# Patient Record
Sex: Female | Born: 1990 | Race: Black or African American | Hispanic: No | Marital: Single | Smoking: Former smoker
Health system: Southern US, Community
[De-identification: ages and names within clinical notes are randomized; demographics above are authoritative.]

## PROBLEM LIST (undated history)

## (undated) DIAGNOSIS — E282 Polycystic ovarian syndrome: Secondary | ICD-10-CM

---

## 2016-12-26 ENCOUNTER — Emergency Department (HOSPITAL_COMMUNITY)
Admission: EM | Admit: 2016-12-26 | Discharge: 2016-12-26 | Disposition: A | Discharge status: Home / Self Care | Payer: Medicaid Other | Attending: Emergency Medicine | Admitting: Emergency Medicine

## 2016-12-26 ENCOUNTER — Encounter (HOSPITAL_COMMUNITY): Payer: Self-pay

## 2016-12-26 DIAGNOSIS — Z5321 Procedure and treatment not carried out due to patient leaving prior to being seen by health care provider: Secondary | ICD-10-CM | POA: Insufficient documentation

## 2016-12-26 DIAGNOSIS — R197 Diarrhea, unspecified: Secondary | ICD-10-CM | POA: Insufficient documentation

## 2016-12-26 DIAGNOSIS — R1084 Generalized abdominal pain: Secondary | ICD-10-CM | POA: Diagnosis present

## 2016-12-26 LAB — URINALYSIS, ROUTINE W REFLEX MICROSCOPIC
Bilirubin Urine: NEGATIVE
Glucose, UA: NEGATIVE mg/dL
Hgb urine dipstick: NEGATIVE
Ketones, ur: NEGATIVE mg/dL
Nitrite: NEGATIVE
Protein, ur: NEGATIVE mg/dL
Specific Gravity, Urine: 1.029 (ref 1.005–1.030)
pH: 5 (ref 5.0–8.0)

## 2016-12-26 LAB — CBC
HCT: 42.9 % (ref 36.0–46.0)
Hemoglobin: 13.5 g/dL (ref 12.0–15.0)
MCH: 28.5 pg (ref 26.0–34.0)
MCHC: 31.5 g/dL (ref 30.0–36.0)
MCV: 90.5 fL (ref 78.0–100.0)
Platelets: 160 10*3/uL (ref 150–400)
RBC: 4.74 MIL/uL (ref 3.87–5.11)
RDW: 13.5 % (ref 11.5–15.5)
WBC: 7.5 10*3/uL (ref 4.0–10.5)

## 2016-12-26 LAB — COMPREHENSIVE METABOLIC PANEL
ALT: 19 U/L (ref 14–54)
AST: 16 U/L (ref 15–41)
Albumin: 3.6 g/dL (ref 3.5–5.0)
Alkaline Phosphatase: 62 U/L (ref 38–126)
Anion gap: 6 (ref 5–15)
BUN: 6 mg/dL (ref 6–20)
CO2: 25 mmol/L (ref 22–32)
Calcium: 9 mg/dL (ref 8.9–10.3)
Chloride: 109 mmol/L (ref 101–111)
Creatinine, Ser: 0.77 mg/dL (ref 0.44–1.00)
GFR calc Af Amer: >60 mL/min (ref >60)
GFR calc non Af Amer: >60 mL/min (ref >60)
Glucose, Bld: 102 mg/dL — ABNORMAL HIGH (ref 65–99)
Potassium: 3.9 mmol/L (ref 3.5–5.1)
Sodium: 140 mmol/L (ref 135–145)
Total Bilirubin: 0.6 mg/dL (ref 0.3–1.2)
Total Protein: 6.6 g/dL (ref 6.5–8.1)

## 2016-12-26 LAB — LIPASE, BLOOD: Lipase: 22 U/L (ref 11–51)

## 2016-12-26 LAB — I-STAT BETA HCG BLOOD, ED (MC, WL, AP ONLY): I-stat hCG, quantitative: <5 m[IU]/mL (ref <5)

## 2016-12-26 NOTE — ED Triage Notes (Signed)
Patient complains of generalized abdominal pain with diarrhea for the past several days. Nausea without vomiting. Alert and oriented, NAD

## 2016-12-26 NOTE — ED Notes (Signed)
Pt sts she was leaving and not returning; encouraged to stay; pt ambulated out of department

## 2017-06-29 ENCOUNTER — Other Ambulatory Visit: Payer: Self-pay

## 2017-06-29 ENCOUNTER — Emergency Department (HOSPITAL_COMMUNITY)
Admission: EM | Admit: 2017-06-29 | Discharge: 2017-06-29 | Disposition: A | Discharge status: Home / Self Care | Payer: Medicaid Other | Attending: Emergency Medicine | Admitting: Emergency Medicine

## 2017-06-29 ENCOUNTER — Encounter (HOSPITAL_COMMUNITY): Payer: Self-pay

## 2017-06-29 DIAGNOSIS — G5601 Carpal tunnel syndrome, right upper limb: Secondary | ICD-10-CM | POA: Diagnosis not present

## 2017-06-29 DIAGNOSIS — F17210 Nicotine dependence, cigarettes, uncomplicated: Secondary | ICD-10-CM | POA: Diagnosis not present

## 2017-06-29 DIAGNOSIS — M25531 Pain in right wrist: Secondary | ICD-10-CM | POA: Diagnosis present

## 2017-06-29 MED ORDER — NAPROXEN 500 MG PO TABS: 500.0000 mg | ORAL_TABLET | Freq: Two times a day (BID) | ORAL | 0 refills | Status: DC

## 2017-06-29 MED ORDER — KETOROLAC TROMETHAMINE 30 MG/ML IJ SOLN
30.0000 mg | Freq: Once | INTRAMUSCULAR | Status: AC
  Administered 2017-06-29: 30 mg via INTRAMUSCULAR
  Filled 2017-06-29: qty 1

## 2017-06-29 MED ORDER — OXYCODONE-ACETAMINOPHEN 5-325 MG PO TABS: 1.0000 | ORAL_TABLET | Freq: Three times a day (TID) | ORAL | 0 refills | Status: DC | PRN

## 2017-06-29 NOTE — Discharge Instructions (Signed)
Please read attached information regarding your condition. Take Percocet as needed for severe pain. Otherwise take naproxen scheduled for the next 5-7 days.  Wear your wrist splint as directed, especially at night. Return to ED for worsening symptoms, trouble breathing or trouble swallowing, numbness of face or legs, injuries or falls.

## 2017-06-29 NOTE — ED Notes (Signed)
shartp pain and swelling in rt hand x 3 days

## 2017-06-29 NOTE — ED Triage Notes (Signed)
Patient complains of 3-4 days of right hand pain with tingling from elbow down, denies trauma. Positive distal pulses

## 2017-06-29 NOTE — ED Provider Notes (Signed)
[facility] Provider Note   CSN: [id number] Arrival date & time: 06/29/17  0801     History   Chief Complaint No chief complaint on file.   HPI Rachel Hull is a 27 y.o. female no significant past medical history, who presents to ED for evaluation of 3-day history of sharp, shooting pain from right wrist to right elbow.  Pain is worse with movement.  She works Education officer, communitymaking sandwiches.  She has tried taking ibuprofen with no improvement in her symptoms.  States that the pain initially felt like "I slept on it wrong."  She then states that it progressed.  No previous history of similar symptoms in the past.  Cannot recall any inciting incident that may have triggered the pain.  Denies any previous fracture, dislocations or procedures in the area.  Denies any facial numbness, numbness in legs, injuries or falls, history of gout, fevers.  HPI  History reviewed. No pertinent past medical history.  There are no active problems to display for this patient.   History reviewed. No pertinent surgical history.  OB History    No data available       Home Medications    Prior to Admission medications   Medication Sig Start Date End Date Taking? Authorizing Provider  naproxen (NAPROSYN) 500 MG tablet Take 1 tablet (500 mg total) by mouth 2 (two) times daily. 06/29/17   Tanza Pellot, PA-C  oxyCODONE-acetaminophen (PERCOCET/ROXICET) 5-325 MG tablet Take 1 tablet by mouth every 8 (eight) hours as needed for severe pain. 06/29/17   Dietrich PatesKhatri, Starling Christofferson, PA-C    Family History No family history on file.  Social History Social History   Tobacco Use  . Smoking status: Current Every Day Smoker    Types: Cigarettes  . Smokeless tobacco: Never Used  Substance Use Topics  . Alcohol use: No  . Drug use: Not on file     Allergies   Flagyl [metronidazole]   Review of Systems Review of Systems  Constitutional: Negative for chills and fever.    Gastrointestinal: Negative for nausea and vomiting.  Musculoskeletal: Positive for arthralgias and myalgias. Negative for back pain, gait problem, joint swelling, neck pain and neck stiffness.  Skin: Negative for wound.  Neurological: Negative for weakness and numbness.     Physical Exam Updated Vital Signs BP 107/74   Pulse 95   Temp 99 F (37.2 C) (Oral)   Resp 18   SpO2 100%   Physical Exam  Constitutional: She appears well-developed and well-nourished. No distress.  Nontoxic appearing in no acute distress.  HENT:  Head: Normocephalic and atraumatic.  Eyes: Conjunctivae and EOM are normal. No scleral icterus.  Neck: Normal range of motion.  Pulmonary/Chest: Effort normal. No respiratory distress.  Musculoskeletal: She exhibits tenderness. She exhibits no edema or deformity.  Positive Phalen's test.  Negative Tinel's test.  Patient reports pain with performing grip strengths of right hand.  Full active and passive range of motion of wrist and digits of the right hand.  2+ radial pulse noted.  Sensation intact to light touch of bilateral upper extremities.  No facial asymmetry noted.  Neurological: She is alert.  Skin: No rash noted. She is not diaphoretic.  Psychiatric: She has a normal mood and affect.  Nursing note and vitals reviewed.    ED Treatments / Results  Labs (all labs ordered are listed, but only abnormal results are displayed) Labs Reviewed - No data to display  EKG  EKG Interpretation None  Radiology No results found.  Procedures Procedures (including critical care time)  Medications Ordered in ED Medications  ketorolac (TORADOL) 30 MG/ML injection 30 mg (not administered)     Initial Impression / Assessment and Plan / ED Course  I have reviewed the triage vital signs and the nursing notes.  Pertinent labs & imaging results that were available during my care of the patient were reviewed by me and considered in my medical decision  making (see chart for details).     Patient presents to ED for evaluation of 3-day history of right wrist tingling sensation, sharp shooting pain radiating up to the elbow.  Denies any injury or inciting event.  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  No previous history of similar symptoms.  On physical exam there is positive Phalen's test but negative Tinel's test.  There is no visible deformity, edema, warmth or color change of joint that would concern me for infectious or vascular cause.  She does appear uncomfortable with performing range of motion but is able to perform.  I suspect that her symptoms could be due to carpal tunnel syndrome based on the nature of her job and the symptoms that she is describing.  Will give a wrist splint, anti-inflammatories and short course of pain medication to be taken as needed.  [place] narcotic database reviewed with no discrepancies.  Advised to follow-up with orthopedics for further evaluation if symptoms persist.  Patient appears stable for discharge at this time.  Strict return precautions given.  Portions of this note were generated with Scientist, clinical (histocompatibility and immunogenetics). Dictation errors may occur despite best attempts at proofreading.  Final Clinical Impressions(s) / ED Diagnoses   Final diagnoses:  Carpal tunnel syndrome of right wrist    ED Discharge Orders        Ordered    oxyCODONE-acetaminophen (PERCOCET/ROXICET) 5-325 MG tablet  Every 8 hours PRN     06/29/17 0942    naproxen (NAPROSYN) 500 MG tablet  2 times daily     06/29/17 0942       Dietrich Pates, PA-C 06/29/17 1308    Bethann Berkshire, MD 06/29/17 1546

## 2017-10-08 ENCOUNTER — Ambulatory Visit (HOSPITAL_COMMUNITY)
Admission: EM | Admit: 2017-10-08 | Discharge: 2017-10-08 | Disposition: A | Discharge status: Home / Self Care | Payer: Medicaid Other | Attending: Family Medicine | Admitting: Family Medicine

## 2017-10-08 ENCOUNTER — Other Ambulatory Visit: Payer: Self-pay

## 2017-10-08 ENCOUNTER — Encounter (HOSPITAL_COMMUNITY): Payer: Self-pay | Admitting: Emergency Medicine

## 2017-10-08 DIAGNOSIS — Z881 Allergy status to other antibiotic agents status: Secondary | ICD-10-CM | POA: Insufficient documentation

## 2017-10-08 DIAGNOSIS — N926 Irregular menstruation, unspecified: Secondary | ICD-10-CM | POA: Insufficient documentation

## 2017-10-08 DIAGNOSIS — Z8249 Family history of ischemic heart disease and other diseases of the circulatory system: Secondary | ICD-10-CM | POA: Insufficient documentation

## 2017-10-08 DIAGNOSIS — F17210 Nicotine dependence, cigarettes, uncomplicated: Secondary | ICD-10-CM | POA: Diagnosis not present

## 2017-10-08 DIAGNOSIS — K5900 Constipation, unspecified: Secondary | ICD-10-CM | POA: Diagnosis not present

## 2017-10-08 DIAGNOSIS — N939 Abnormal uterine and vaginal bleeding, unspecified: Secondary | ICD-10-CM | POA: Insufficient documentation

## 2017-10-08 LAB — POCT I-STAT, CHEM 8
BUN: 5 mg/dL — ABNORMAL LOW (ref 6–20)
Calcium, Ion: 1.2 mmol/L (ref 1.15–1.40)
Chloride: 102 mmol/L (ref 101–111)
Creatinine, Ser: 0.7 mg/dL (ref 0.44–1.00)
Glucose, Bld: 83 mg/dL (ref 65–99)
HCT: 45 % (ref 36.0–46.0)
Hemoglobin: 15.3 g/dL — ABNORMAL HIGH (ref 12.0–15.0)
Potassium: 3.8 mmol/L (ref 3.5–5.1)
Sodium: 139 mmol/L (ref 135–145)
TCO2: 25 mmol/L (ref 22–32)

## 2017-10-08 LAB — POCT PREGNANCY, URINE: Preg Test, Ur: NEGATIVE

## 2017-10-08 MED ORDER — NORETHIN ACE-ETH ESTRAD-FE 1-20 MG-MCG PO TABS: 1.0000 | ORAL_TABLET | Freq: Every day | ORAL | 11 refills | Status: DC

## 2017-10-08 NOTE — Discharge Instructions (Signed)
We are testing you for bacterial infections as possible causes of your vaginal bleeding.  Will notify you of any positive findings and if any changes to treatment are needed.   In the mean time we will start you on oral birth control to try to stop the bleeding.  Please establish with a gynecologist for recheck within the next month.  If you develop worsening of pain, bleeding, dizziness, or otherwise worsening please return to be seen or go to Er.

## 2017-10-08 NOTE — ED Provider Notes (Signed)
[facility]    CSN: [id number] Arrival date & time: 10/08/17  1124     History   Chief Complaint Chief Complaint  Patient presents with  . Vaginal Bleeding    HPI Jerilynn Blain PaisBetrand is a 27 y.o. female.   Sherry Ruffingntoinette presents with complaints of vaginal bleeding which has been ongoing for the past month. States she has had irregular periods, but typically have never lasted an entire month. Has waxed in waned, sometimes spotting and then will become heavy again. Today has been more heavy. Mild suprapubic discomfort. No fevers. No urinary symptoms. Has been constipated. Does not have a local PCP or gynecologist [place]. Sexually active with 1 partner, does not use condoms. No specific known STD concern. Not on birth control . Denies any other previous medical history.    ROS per HPI.      History reviewed. No pertinent past medical history.  There are no active problems to display for this patient.   History reviewed. No pertinent surgical history.  OB History   None      Home Medications    Prior to Admission medications   Medication Sig Start Date End Date Taking? Authorizing Provider  norethindrone-ethinyl estradiol (JUNEL FE,GILDESS FE,LOESTRIN FE) 1-20 MG-MCG tablet Take 1 tablet by mouth daily. 10/08/17   [name], Rahil Passey B, NP    Family History Family History  Problem Relation Age of Onset  . Hypertension Mother     Social History Social History   Tobacco Use  . Smoking status: Current Every Day Smoker    Types: Cigarettes  . Smokeless tobacco: Never Used  Substance Use Topics  . Alcohol use: No  . Drug use: Not on file     Allergies   Flagyl [metronidazole]   Review of Systems Review of Systems   Physical Exam Triage Vital Signs ED Triage Vitals  Enc Vitals Group     BP 10/08/17 1237 109/68     Pulse Rate 10/08/17 1237 85     Resp 10/08/17 1237 20     Temp 10/08/17 1237 98.9 F (37.2 C)     Temp  Source 10/08/17 1237 Oral     SpO2 10/08/17 1237 100 %     Weight --      Height --      Head Circumference --      Peak Flow --      Pain Score 10/08/17 1234 7     Pain Loc --      Pain Edu? --      Excl. in GC? --    No data found.  Updated Vital Signs BP 109/68 (BP Location: Right Arm) Comment (BP Location): large cuff  Pulse 85   Temp 98.9 F (37.2 C) (Oral)   Resp 20   SpO2 100%   Physical Exam  Constitutional: She is oriented to person, place, and time. She appears well-developed and well-nourished. No distress.  Cardiovascular: Normal rate, regular rhythm and normal heart sounds.  Pulmonary/Chest: Effort normal and breath sounds normal.  Abdominal: Soft. There is no tenderness.  Genitourinary: Cervix exhibits no motion tenderness. There is bleeding in the vagina.  Genitourinary Comments: Very slight generalized suprapubic tenderness with bimanual exam; moderate to large vaginal bleeding; vaginal cytology collected   Neurological: She is alert and oriented to person, place, and time.  Skin: Skin is warm and dry.     UC Treatments / Results  Labs (all labs ordered are listed, but  only abnormal results are displayed) Labs Reviewed  POCT I-STAT, CHEM 8 - Abnormal; Notable for the following components:      Result Value   BUN 5 (*)    Hemoglobin 15.3 (*)    All other components within normal limits  URINE CULTURE  POCT PREGNANCY, URINE  CERVICOVAGINAL ANCILLARY ONLY    EKG None  Radiology No results found.  Procedures Procedures (including critical care time)  Medications Ordered in UC Medications - No data to display  Initial Impression / Assessment and Plan / UC Course  I have reviewed the triage vital signs and the nursing notes.  Pertinent labs & imaging results that were available during my care of the patient were reviewed by me and considered in my medical decision making (see chart for details).     Chem 8 reassuring. Urine culture and  vaginal cytology pending at this time. Oral birth control initiated at this time due to 1 month of bleeding. Encouraged establish and follow up with gynecology for recheck. Return precautions provided. Patient verbalized understanding and agreeable to plan.  Ambulatory out of clinic without difficulty.    Final Clinical Impressions(s) / UC Diagnoses   Final diagnoses:  Abnormal uterine bleeding     Discharge Instructions     We are testing you for bacterial infections as possible causes of your vaginal bleeding.  Will notify you of any positive findings and if any changes to treatment are needed.   In the mean time we will start you on oral birth control to try to stop the bleeding.  Please establish with a gynecologist for recheck within the next month.  If you develop worsening of pain, bleeding, dizziness, or otherwise worsening please return to be seen or go to Er.     ED Prescriptions    Medication Sig Dispense Auth. Provider   norethindrone-ethinyl estradiol (JUNEL FE,GILDESS FE,LOESTRIN FE) 1-20 MG-MCG tablet Take 1 tablet by mouth daily. 1 Package Georgetta Haber, NP     Controlled Substance Prescriptions [place] Controlled Substance Registry consulted? Not Applicable   Georgetta Haber, NP 10/08/17 1326

## 2017-10-08 NOTE — ED Triage Notes (Signed)
Vaginal bleeding intermittently for a month.  Low abdominal tenderness.  Denies pain with urination

## 2017-10-08 NOTE — ED Notes (Signed)
PATIENT SENT FOR A DIRTY AND CLEAN SPECIMEN

## 2017-10-09 LAB — CERVICOVAGINAL ANCILLARY ONLY
Bacterial vaginitis: POSITIVE — AB
Candida vaginitis: NEGATIVE
Chlamydia: NEGATIVE
Neisseria Gonorrhea: NEGATIVE
Trichomonas: NEGATIVE

## 2017-10-09 LAB — URINE CULTURE: Culture: <10000 — AB

## 2017-10-12 ENCOUNTER — Telehealth (HOSPITAL_COMMUNITY): Payer: Self-pay

## 2017-10-12 MED ORDER — CLINDAMYCIN HCL 300 MG PO CAPS: 300.0000 mg | ORAL_CAPSULE | Freq: Three times a day (TID) | ORAL | 0 refills | Status: AC

## 2017-10-12 NOTE — Telephone Encounter (Signed)
Bacterial vaginosis is positive. This was not treated at the urgent care visit.  Attempted to reach patient. No answer at this time.

## 2017-10-12 NOTE — Telephone Encounter (Signed)
Pt called regarding results. bv positive and not treated at urgent care. Pt does have symptoms but is allergic to Flagyl.  Per Dr. Delton SeeNelson patient given Clindamycin 300 mg TID x 7 days and sent to the pt pharmacy of choice.

## 2017-10-29 ENCOUNTER — Other Ambulatory Visit: Payer: Self-pay

## 2017-10-29 ENCOUNTER — Encounter (HOSPITAL_COMMUNITY): Payer: Self-pay | Admitting: Emergency Medicine

## 2017-10-29 ENCOUNTER — Ambulatory Visit (HOSPITAL_COMMUNITY)
Admission: EM | Admit: 2017-10-29 | Discharge: 2017-10-29 | Disposition: A | Discharge status: Home / Self Care | Payer: Medicaid Other | Attending: Family Medicine | Admitting: Family Medicine

## 2017-10-29 DIAGNOSIS — Z09 Encounter for follow-up examination after completed treatment for conditions other than malignant neoplasm: Secondary | ICD-10-CM

## 2017-10-29 DIAGNOSIS — N938 Other specified abnormal uterine and vaginal bleeding: Secondary | ICD-10-CM

## 2017-10-29 NOTE — ED Notes (Signed)
Bed: UC01 Expected date:  Expected time:  Means of arrival:  Comments: Appt Only 

## 2017-10-29 NOTE — ED Triage Notes (Signed)
Seen 6/10.  Told to return after finishing medicines.  Vaginal bleeding has stopped.  Patient wants to make sure medicines helped infection

## 2017-10-29 NOTE — Discharge Instructions (Signed)
Please establish with a primary care provider for continued management of menstruation.  If any return or worsening of symptoms return for reevaluation.

## 2017-10-29 NOTE — ED Provider Notes (Signed)
[facility]    CSN: [id number] Arrival date & time: 10/29/17  1549     History   Chief Complaint Chief Complaint  Patient presents with  . Follow-up    appt 4:00 pm    HPI Rachel Hull is a 27 y.o. female.   Rachel Hull presents for follow up after visit 6/10 for abnormal uterine bleeding. Urine cytology positive for BV at that time, states she completed course of treatment. Has since been on oral birth control. No longer having any vaginal bleeding. Does not have a PCP as recently moved to the area. States she was unsure if she needed repeat testing to ensure cure of BV. Denies any vaginal symptoms- discharge, itching, burning, bleeding. No urinary symptoms or fevers. She states she overall feels well.    ROS per HPI.      History reviewed. No pertinent past medical history.  There are no active problems to display for this patient.   History reviewed. No pertinent surgical history.  OB History   None      Home Medications    Prior to Admission medications   Medication Sig Start Date End Date Taking? Authorizing Provider  norethindrone-ethinyl estradiol (JUNEL FE,GILDESS FE,LOESTRIN FE) 1-20 MG-MCG tablet Take 1 tablet by mouth daily. 10/08/17   Rachel Hull, Selby Foisy B, NP    Family History Family History  Problem Relation Age of Onset  . Hypertension Mother     Social History Social History   Tobacco Use  . Smoking status: Current Every Day Smoker    Types: Cigarettes  . Smokeless tobacco: Never Used  Substance Use Topics  . Alcohol use: No  . Drug use: Not on file     Allergies   Flagyl [metronidazole]   Review of Systems Review of Systems   Physical Exam Triage Vital Signs ED Triage Vitals  Enc Vitals Group     BP 10/29/17 1613 109/73     Pulse Rate 10/29/17 1613 89     Resp 10/29/17 1613 18     Temp 10/29/17 1613 98.3 F (36.8 C)     Temp Source 10/29/17 1613 Oral     SpO2 10/29/17 1613 99 %     Weight --      Height  --      Head Circumference --      Peak Flow --      Pain Score 10/29/17 1605 0     Pain Loc --      Pain Edu? --      Excl. in GC? --    No data found.  Updated Vital Signs BP 109/73 (BP Location: Right Arm) Comment: large cuff  Pulse 89   Temp 98.3 F (36.8 C) (Oral)   Resp 18   SpO2 99%    Physical Exam  Constitutional: She is oriented to person, place, and time. She appears well-developed and well-nourished. No distress.  Cardiovascular: Normal rate, regular rhythm and normal heart sounds.  Pulmonary/Chest: Effort normal and breath sounds normal.  Neurological: She is alert and oriented to person, place, and time.  Skin: Skin is warm and dry.     UC Treatments / Results  Labs (all labs ordered are listed, but only abnormal results are displayed) Labs Reviewed - No data to display  EKG None  Radiology No results found.  Procedures Procedures (including critical care time)  Medications Ordered in UC Medications - No data to display  Initial Impression / Assessment and Plan / UC Course  I have reviewed the triage vital signs and the nursing notes.  Pertinent labs & imaging results that were available during my care of the patient were reviewed by me and considered in my medical decision making (see chart for details).     Currently without any vaginal symptoms after BV treatment. No longer with any vaginal bleeding. Still taking birth control. No further testing indicated at this time. Encouraged establish with PCP/gyn for continued management. Patient verbalized understanding and agreeable to plan.    Final Clinical Impressions(s) / UC Diagnoses   Final diagnoses:  Follow-up exam after treatment     Discharge Instructions     Please establish with a primary care provider for continued management of menstruation.  If any return or worsening of symptoms return for reevaluation.    ED Prescriptions    None     Controlled Substance  Prescriptions [place] Controlled Substance Registry consulted? Not Applicable   Rachel Haber, NP 10/29/17 1645

## 2018-03-16 ENCOUNTER — Ambulatory Visit (HOSPITAL_COMMUNITY)
Admission: EM | Admit: 2018-03-16 | Discharge: 2018-03-16 | Disposition: A | Discharge status: Home / Self Care | Payer: Medicaid Other | Attending: Radiology | Admitting: Radiology

## 2018-03-16 ENCOUNTER — Encounter (HOSPITAL_COMMUNITY): Payer: Self-pay | Admitting: Emergency Medicine

## 2018-03-16 ENCOUNTER — Other Ambulatory Visit: Payer: Self-pay

## 2018-03-16 DIAGNOSIS — N644 Mastodynia: Secondary | ICD-10-CM

## 2018-03-16 MED ORDER — CEPHALEXIN 500 MG PO CAPS: 500.0000 mg | ORAL_CAPSULE | Freq: Four times a day (QID) | ORAL | 0 refills | Status: DC

## 2018-03-16 NOTE — ED Provider Notes (Signed)
[facility]    CSN: [id number] Arrival date & time: 03/16/18  1654     History   Chief Complaint Chief Complaint  Patient presents with  . Breast Pain    HPI Rachel Hull is a 27 y.o. female.   27 year old female presents with right breast pain and discharge 3 days patient states that she previously had a nipple ring however increased tendency with menses caused her to remove the nipple ring approximately 1 month ago patient states that she has increased tenderness with discharge initially from the former nipple ring site and then through the nipple itself patient describes initially as clear becoming a milky and blood tinged no discharge noted upon assessment.  Patient denies any fevers.  Patient has dense area at 4:00 with increased pain upon palpation and erythema around her areola.  Patient denies breast-feeding at this time.     History reviewed. No pertinent past medical history.  There are no active problems to display for this patient.   History reviewed. No pertinent surgical history.  OB History   None      Home Medications    Prior to Admission medications   Not on File    Family History Family History  Problem Relation Age of Onset  . Hypertension Mother     Social History Social History   Tobacco Use  . Smoking status: Current Every Day Smoker    Types: Cigarettes  . Smokeless tobacco: Never Used  Substance Use Topics  . Alcohol use: No  . Drug use: Not on file     Allergies   Flagyl [metronidazole]   Review of Systems Review of Systems  Constitutional: Negative for chills and fever.  HENT: Negative for ear pain and sore throat.   Eyes: Negative for pain and visual disturbance.  Respiratory: Negative for cough and shortness of breath.   Cardiovascular: Negative for chest pain and palpitations.  Gastrointestinal: Negative for abdominal pain and vomiting.  Genitourinary: Negative for dysuria and hematuria.    Musculoskeletal: Negative for arthralgias and back pain.  Skin: Negative for color change and rash.       Tenderness redness and discharge to right nipple  Neurological: Negative for seizures and syncope.  All other systems reviewed and are negative.    Physical Exam Triage Vital Signs ED Triage Vitals  Enc Vitals Group     BP 03/16/18 1717 108/68     Pulse Rate 03/16/18 1717 (!) 105     Resp 03/16/18 1717 18     Temp 03/16/18 1717 98.6 F (37 C)     Temp Source 03/16/18 1717 Oral     SpO2 03/16/18 1717 99 %     Weight --      Height --      Head Circumference --      Peak Flow --      Pain Score 03/16/18 1715 7     Pain Loc --      Pain Edu? --      Excl. in GC? --    No data found.  Updated Vital Signs BP 108/68 (BP Location: Right Arm)   Pulse (!) 105   Temp 98.6 F (37 C) (Oral)   Resp 18   SpO2 99%   Visual Acuity Right Eye Distance:   Left Eye Distance:   Bilateral Distance:    Right Eye Near:   Left Eye Near:    Bilateral Near:     Physical Exam  Constitutional:  She is oriented to person, place, and time. She appears well-developed and well-nourished.  HENT:  Head: Normocephalic and atraumatic.  Eyes: Conjunctivae are normal.  Neck: Normal range of motion.  Pulmonary/Chest: Effort normal.  Neurological: She is alert and oriented to person, place, and time.  Skin: Rash noted.  Erythema noted around the areole out to the right breast with dense area at 4:00.  No discharge noted upon evaluation.  Psychiatric: She has a normal mood and affect.  Nursing note and vitals reviewed.    UC Treatments / Results  Labs (all labs ordered are listed, but only abnormal results are displayed) Labs Reviewed - No data to display  EKG None  Radiology No results found.  Procedures Procedures (including critical care time)  Medications Ordered in UC Medications - No data to display  Initial Impression / Assessment and Plan / UC Course  I have  reviewed the triage vital signs and the nursing notes.  Pertinent labs & imaging results that were available during my care of the patient were reviewed by me and considered in my medical decision making (see chart for details).      Final Clinical Impressions(s) / UC Diagnoses   Final diagnoses:  None   Discharge Instructions   None    ED Prescriptions    None     Controlled Substance Prescriptions [place] Controlled Substance Registry consulted? Not Applicable   Alene Mires, NP 03/16/18 1736

## 2018-03-16 NOTE — Discharge Instructions (Addendum)
You will be contacted to schedule US  for further evaluation.

## 2018-03-16 NOTE — ED Triage Notes (Addendum)
[facility] a complaint of right breast soreness and tenderness x 3 days. The patient reported that she had nipple rings that she removed last month due to increased pain during menses.

## 2018-03-19 ENCOUNTER — Other Ambulatory Visit: Payer: Self-pay | Admitting: Radiology

## 2018-03-19 ENCOUNTER — Other Ambulatory Visit: Payer: Self-pay

## 2018-03-19 DIAGNOSIS — R5381 Other malaise: Secondary | ICD-10-CM

## 2018-04-02 ENCOUNTER — Telehealth (HOSPITAL_COMMUNITY): Payer: Self-pay | Admitting: Emergency Medicine

## 2018-04-02 ENCOUNTER — Other Ambulatory Visit: Payer: Self-pay | Admitting: Radiology

## 2018-04-02 DIAGNOSIS — N6452 Nipple discharge: Secondary | ICD-10-CM

## 2018-04-02 NOTE — Telephone Encounter (Signed)
Pt called; resent fax to breast center and told pt to call them provided number

## 2018-04-03 ENCOUNTER — Other Ambulatory Visit: Payer: Self-pay | Admitting: Family Medicine

## 2018-04-03 ENCOUNTER — Other Ambulatory Visit: Payer: Self-pay

## 2018-04-03 ENCOUNTER — Telehealth (HOSPITAL_COMMUNITY): Payer: Self-pay | Admitting: Emergency Medicine

## 2018-04-03 DIAGNOSIS — N6452 Nipple discharge: Secondary | ICD-10-CM

## 2018-04-03 NOTE — Telephone Encounter (Signed)
Center needed new order signed by Physician.  A new request was sent with mammogram and US checked with Dr. Tommi RumpsLauensteins signature along with a copy of the order also signed.

## 2018-04-04 ENCOUNTER — Other Ambulatory Visit: Payer: Self-pay | Admitting: Radiology

## 2018-04-04 ENCOUNTER — Ambulatory Visit
Admission: RE | Admit: 2018-04-04 | Discharge: 2018-04-04 | Disposition: A | Discharge status: Home / Self Care | Payer: Medicaid Other | Source: Ambulatory Visit | Attending: Radiology | Admitting: Radiology

## 2018-04-04 DIAGNOSIS — N6452 Nipple discharge: Secondary | ICD-10-CM

## 2018-04-19 ENCOUNTER — Other Ambulatory Visit: Payer: Self-pay | Admitting: Radiology

## 2018-04-22 ENCOUNTER — Ambulatory Visit
Admission: RE | Admit: 2018-04-22 | Discharge: 2018-04-22 | Disposition: A | Discharge status: Home / Self Care | Payer: Medicaid Other | Source: Ambulatory Visit | Attending: Radiology | Admitting: Radiology

## 2018-04-22 ENCOUNTER — Other Ambulatory Visit: Payer: Self-pay | Admitting: Radiology

## 2018-04-22 DIAGNOSIS — N6452 Nipple discharge: Secondary | ICD-10-CM

## 2019-02-24 ENCOUNTER — Ambulatory Visit (HOSPITAL_COMMUNITY)
Admission: EM | Admit: 2019-02-24 | Discharge: 2019-02-24 | Disposition: A | Discharge status: Home / Self Care | Payer: Medicaid Other | Attending: Family Medicine | Admitting: Family Medicine

## 2019-02-24 ENCOUNTER — Other Ambulatory Visit: Payer: Self-pay

## 2019-02-24 ENCOUNTER — Encounter (HOSPITAL_COMMUNITY): Payer: Self-pay

## 2019-02-24 DIAGNOSIS — T7840XA Allergy, unspecified, initial encounter: Secondary | ICD-10-CM | POA: Diagnosis not present

## 2019-02-24 MED ORDER — METHYLPREDNISOLONE ACETATE 80 MG/ML IJ SUSP
INTRAMUSCULAR | Status: AC
Start: 2019-02-24 — End: ?
  Filled 2019-02-24: qty 1

## 2019-02-24 MED ORDER — METHYLPREDNISOLONE ACETATE 80 MG/ML IJ SUSP
80.0000 mg | Freq: Once | INTRAMUSCULAR | Status: AC
  Administered 2019-02-24: 80 mg via INTRAMUSCULAR

## 2019-02-24 NOTE — ED Provider Notes (Signed)
[place]    CSN: [id number] Arrival date & time: 02/24/19  1537      History   Chief Complaint Chief Complaint  Patient presents with  . Pruritis    HPI Atiyana Hull is a 28 y.o. female.   HPI  since yesterday general rash and itching, scratching skin leaves marks, relieved with benadryl, improved with cortisone cream but keeps coming back.  No new product, food, med No prior hives No short ness of breath or trouble speaking/swallowing  History reviewed. No pertinent past medical history.  There are no active problems to display for this patient.   History reviewed. No pertinent surgical history.  OB History   No obstetric history on file.      Home Medications    Prior to Admission medications   Not on File    Family History Family History  Problem Relation Age of Onset  . Hypertension Mother     Social History Social History   Tobacco Use  . Smoking status: Former Smoker    Types: Cigarettes  . Smokeless tobacco: Never Used  Substance Use Topics  . Alcohol use: Yes    Comment: socailly  . Drug use: Not on file     Allergies   Flagyl [metronidazole]   Review of Systems Review of Systems  Constitutional: Negative for chills and fever.  HENT: Negative for ear pain and sore throat.   Eyes: Negative for pain and visual disturbance.  Respiratory: Negative for cough and shortness of breath.   Cardiovascular: Negative for chest pain and palpitations.  Gastrointestinal: Negative for abdominal pain and vomiting.  Genitourinary: Negative for dysuria and hematuria.  Musculoskeletal: Negative for arthralgias and back pain.  Skin: Positive for rash. Negative for color change.       itching  Neurological: Negative for seizures and syncope.  All other systems reviewed and are negative.    Physical Exam Triage Vital Signs ED Triage Vitals  Enc Vitals Group     BP 02/24/19 1622 116/68     Pulse Rate 02/24/19 1622 75   Resp 02/24/19 1622 16     Temp 02/24/19 1622 98.6 F (37 C)     Temp Source 02/24/19 1622 Temporal     SpO2 02/24/19 1622 100 %   No data found.  Updated Vital Signs BP 116/68 (BP Location: Right Arm)   Pulse 75   Temp 98.6 F (37 C) (Temporal)   Resp 16   SpO2 100%       Physical Exam Constitutional:      General: She is not in acute distress.    Appearance: She is well-developed.  HENT:     Head: Normocephalic and atraumatic.     Mouth/Throat:     Comments: Mask in place Eyes:     Conjunctiva/sclera: Conjunctivae normal.     Pupils: Pupils are equal, round, and reactive to light.  Neck:     Musculoskeletal: Normal range of motion.  Cardiovascular:     Rate and Rhythm: Normal rate.  Pulmonary:     Effort: Pulmonary effort is normal. No respiratory distress.  Abdominal:     General: There is no distension.     Palpations: Abdomen is soft.  Musculoskeletal: Normal range of motion.  Skin:    General: Skin is warm and dry.     Comments: No rash seen.  Dermatographism demonstrated  Neurological:     General: No focal deficit present.     Mental Status: She is  alert.  Psychiatric:        Mood and Affect: Mood normal.        Behavior: Behavior normal.      UC Treatments / Results  Labs (all labs ordered are listed, but only abnormal results are displayed) Labs Reviewed - No data to display  EKG   Radiology No results found.  Procedures Procedures (including critical care time)  Medications Ordered in UC Medications  methylPREDNISolone acetate (DEPO-MEDROL) injection 80 mg (has no administration in time range)    Initial Impression / Assessment and Plan / UC Course  I have reviewed the triage vital signs and the nursing notes.  Pertinent labs & imaging results that were available during my care of the patient were reviewed by me and considered in my medical decision making (see chart for details).     Reviewed with patient that appears to be an  allergic reaction, although I cannot tell her due to what Final Clinical Impressions(s) / UC Diagnoses   Final diagnoses:  Allergic reaction, initial encounter     Discharge Instructions     Take Benadryl as needed at night for the itch During the day you can take Zyrtec.  This will not cause as much drowsiness.  You may take 2 pills every morning The shot will give you steroids to last for a few days This should take care of your itching If this comes back or becomes a recurring problem, I recommend allergy testing   ED Prescriptions    None     PDMP not reviewed this encounter.   Eustace Moore, MD 02/24/19 2124

## 2019-02-24 NOTE — ED Triage Notes (Signed)
Patient presents to Urgent Care with complaints of generalized itching since yesterday. Patient reports "literally everything but my privates are itchy". Pt denies new detergents or body soaps, has been trying cortisone cream with no relief.

## 2019-02-24 NOTE — Discharge Instructions (Signed)
Take Benadryl as needed at night for the itch During the day you can take Zyrtec.  This will not cause as much drowsiness.  You may take 2 pills every morning The shot will give you steroids to last for a few days This should take care of your itching If this comes back or becomes a recurring problem, I recommend allergy testing

## 2019-02-27 ENCOUNTER — Ambulatory Visit (HOSPITAL_COMMUNITY)
Admission: EM | Admit: 2019-02-27 | Discharge: 2019-02-27 | Disposition: A | Discharge status: Home / Self Care | Payer: Medicaid Other

## 2019-02-27 ENCOUNTER — Encounter (HOSPITAL_COMMUNITY): Payer: Self-pay

## 2019-02-27 ENCOUNTER — Other Ambulatory Visit: Payer: Self-pay

## 2019-02-27 DIAGNOSIS — R21 Rash and other nonspecific skin eruption: Secondary | ICD-10-CM

## 2019-02-27 NOTE — Discharge Instructions (Signed)
Continue to take the Benadryl and Zyrtec as directed.    Call the dermatologist listed below to schedule an appointment.

## 2019-02-27 NOTE — ED Triage Notes (Signed)
Pt presents with recurrent rash all over that comes and goes from unknown source; pt has been been seen a few times for issue and thinks it may be from an exposure of something at work.

## 2019-02-27 NOTE — ED Provider Notes (Signed)
[facility]    CSN: [id number] Arrival date & time: 02/27/19  4765      History   Chief Complaint Chief Complaint  Patient presents with  . Follow-up  . Rash    HPI Rachel Hull is a 28 y.o. female.   Patient presents with ongoing pruritic rash "all over" x1 week.  She was seen here on 02/24/2019 and treated with Depo-Medrol and instructed to take Benadryl and Zyrtec for the itching.  Patient reports little relief with these medications.  She states she had to leave work today due to the itching and needs a work note.  She denies difficulty swallowing or breathing.  She denies new detergents or products.  She denies fever, chills, sore throat, or other symptoms.  The history is provided by the patient.    History reviewed. No pertinent past medical history.  There are no active problems to display for this patient.   History reviewed. No pertinent surgical history.  OB History   No obstetric history on file.      Home Medications    Prior to Admission medications   Not on File    Family History Family History  Problem Relation Age of Onset  . Hypertension Mother     Social History Social History   Tobacco Use  . Smoking status: Former Smoker    Types: Cigarettes  . Smokeless tobacco: Never Used  Substance Use Topics  . Alcohol use: Yes    Comment: socailly  . Drug use: Not on file     Allergies   Flagyl [metronidazole]   Review of Systems Review of Systems  Constitutional: Negative for chills and fever.  HENT: Negative for ear pain and sore throat.   Eyes: Negative for pain and visual disturbance.  Respiratory: Negative for cough and shortness of breath.   Cardiovascular: Negative for chest pain and palpitations.  Gastrointestinal: Negative for abdominal pain and vomiting.  Genitourinary: Negative for dysuria and hematuria.  Musculoskeletal: Negative for arthralgias and back pain.  Skin: Positive for rash. Negative for  color change.  Neurological: Negative for seizures and syncope.  All other systems reviewed and are negative.    Physical Exam Triage Vital Signs ED Triage Vitals [02/27/19 0902]  Enc Vitals Group     BP 120/78     Pulse Rate 89     Resp 16     Temp 98.2 F (36.8 C)     Temp Source Oral     SpO2 99 %     Weight      Height      Head Circumference      Peak Flow      Pain Score      Pain Loc      Pain Edu?      Excl. in GC?    No data found.  Updated Vital Signs BP 120/78 (BP Location: Left Arm)   Pulse 89   Temp 98.2 F (36.8 C) (Oral)   Resp 16   SpO2 99%   Visual Acuity Right Eye Distance:   Left Eye Distance:   Bilateral Distance:    Right Eye Near:   Left Eye Near:    Bilateral Near:     Physical Exam Vitals signs and nursing note reviewed.  Constitutional:      General: She is not in acute distress.    Appearance: She is well-developed.  HENT:     Head: Normocephalic and atraumatic.  Eyes:  Conjunctiva/sclera: Conjunctivae normal.  Neck:     Musculoskeletal: Neck supple.  Cardiovascular:     Rate and Rhythm: Normal rate and regular rhythm.     Heart sounds: No murmur.  Pulmonary:     Effort: Pulmonary effort is normal. No respiratory distress.     Breath sounds: Normal breath sounds.  Abdominal:     Palpations: Abdomen is soft.     Tenderness: There is no abdominal tenderness.  Skin:    General: Skin is warm and dry.     Capillary Refill: Capillary refill takes less than 2 seconds.     Findings: No bruising, erythema, lesion or rash.     Comments: No rash seen.  Neurological:     General: No focal deficit present.     Mental Status: She is alert and oriented to person, place, and time.      UC Treatments / Results  Labs (all labs ordered are listed, but only abnormal results are displayed) Labs Reviewed - No data to display  EKG   Radiology No results found.  Procedures Procedures (including critical care time)   Medications Ordered in UC Medications - No data to display  Initial Impression / Assessment and Plan / UC Course  I have reviewed the triage vital signs and the nursing notes.  Pertinent labs & imaging results that were available during my care of the patient were reviewed by me and considered in my medical decision making (see chart for details).    Rash.  Patient is well-appearing and no rash is noted on exam.  Depo-Medrol given on 02/24/2019.  Instructed patient to continue taking Zyrtec during the day and Benadryl at night.  Instructed her to call a dermatologist to schedule an appointment to be seen.  Patient agrees to plan of care.   Final Clinical Impressions(s) / UC Diagnoses   Final diagnoses:  Rash     Discharge Instructions     Continue to take the Benadryl and Zyrtec as directed.    Call the dermatologist listed below to schedule an appointment.      ED Prescriptions    None     PDMP not reviewed this encounter.   Sharion Balloon, NP 02/27/19 [phone]

## 2019-03-18 ENCOUNTER — Emergency Department (HOSPITAL_COMMUNITY)
Admission: EM | Admit: 2019-03-18 | Discharge: 2019-03-18 | Disposition: A | Discharge status: Home / Self Care | Payer: Medicaid Other | Attending: Emergency Medicine | Admitting: Emergency Medicine

## 2019-03-18 ENCOUNTER — Other Ambulatory Visit: Payer: Self-pay

## 2019-03-18 ENCOUNTER — Encounter (HOSPITAL_COMMUNITY): Payer: Self-pay | Admitting: Emergency Medicine

## 2019-03-18 DIAGNOSIS — R110 Nausea: Secondary | ICD-10-CM | POA: Diagnosis not present

## 2019-03-18 DIAGNOSIS — Z5321 Procedure and treatment not carried out due to patient leaving prior to being seen by health care provider: Secondary | ICD-10-CM | POA: Diagnosis not present

## 2019-03-18 DIAGNOSIS — R1032 Left lower quadrant pain: Secondary | ICD-10-CM | POA: Diagnosis present

## 2019-03-18 NOTE — ED Triage Notes (Signed)
Pt voiced to charge RN that she has been told several times that she was next to go back and now she wanted to leave and wanted a note saying when she arrived and when she left. RN produced a note for her and pt left AMA

## 2019-03-18 NOTE — ED Triage Notes (Signed)
Per pt, states abdominal pain, left lower-states symptoms going on for 3 days-states pain is sharp-slight nausea

## 2019-04-09 IMAGING — US ULTRASOUND RIGHT BREAST LIMITED
1 series · 10 of 10 positions shown · non-contrast
Comparison: None.

CLINICAL DATA: 27-year-old female with persistent right breast
pain. The patient states she was previously experiencing symptoms of
right breast areolar pain, tenderness and yellow nipple discharge
with fevers. She was treated for an infection and placed on a course
of antibiotics, however states her symptoms initially improved but
feels as if they may be worsening again.

EXAM:
ULTRASOUND OF THE RIGHT BREAST

[Series 1: ultrasound right breast limited · 0.06mm/px · 10 of 10 slices shown]
[im 1/10]
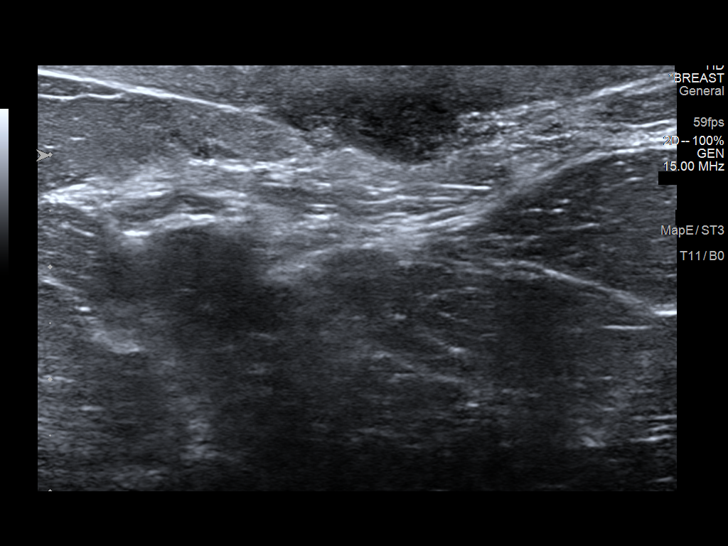
[im 2/10]
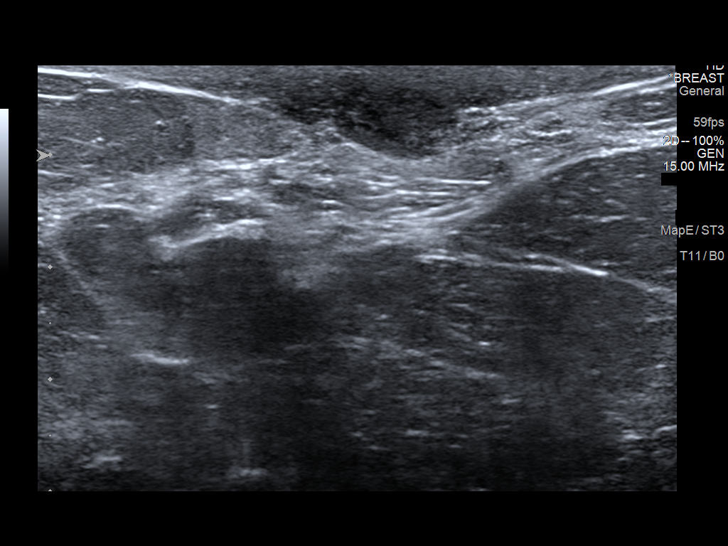
[im 3/10]
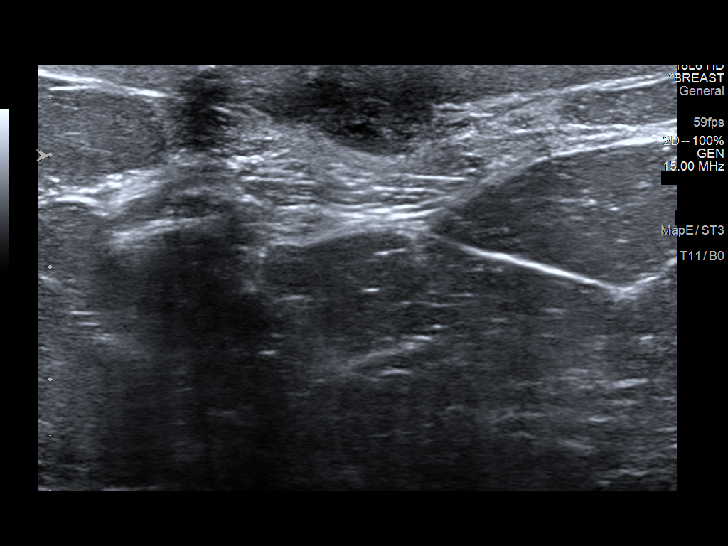
[im 4/10]
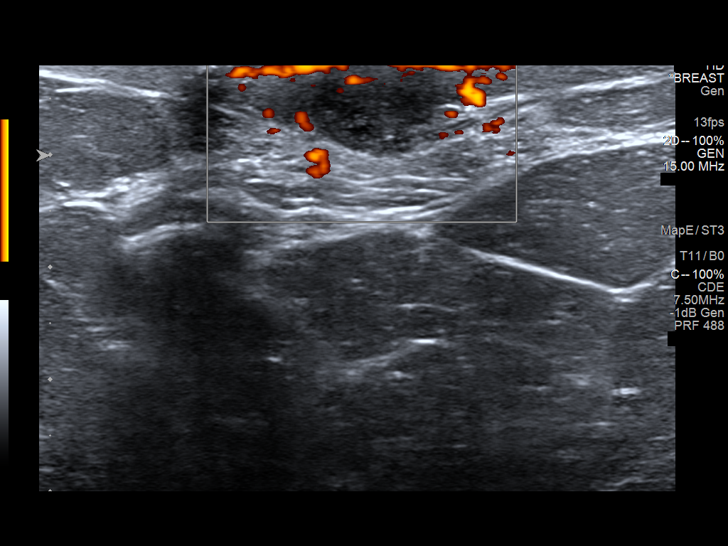
[im 5/10]
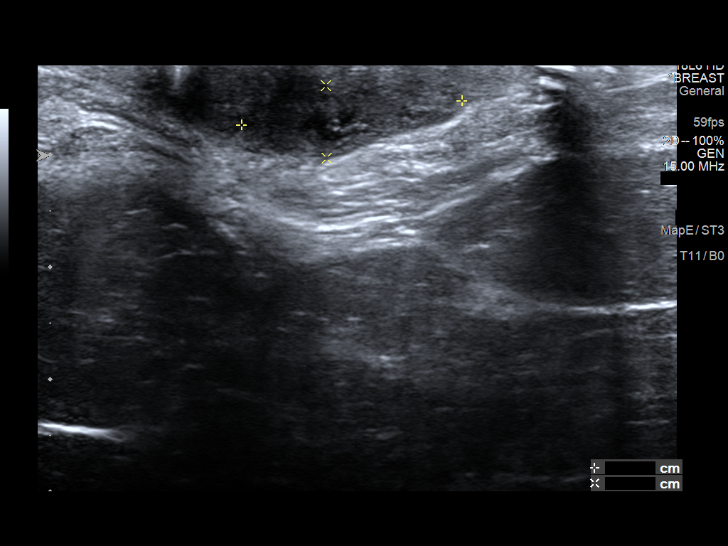
[im 6/10]
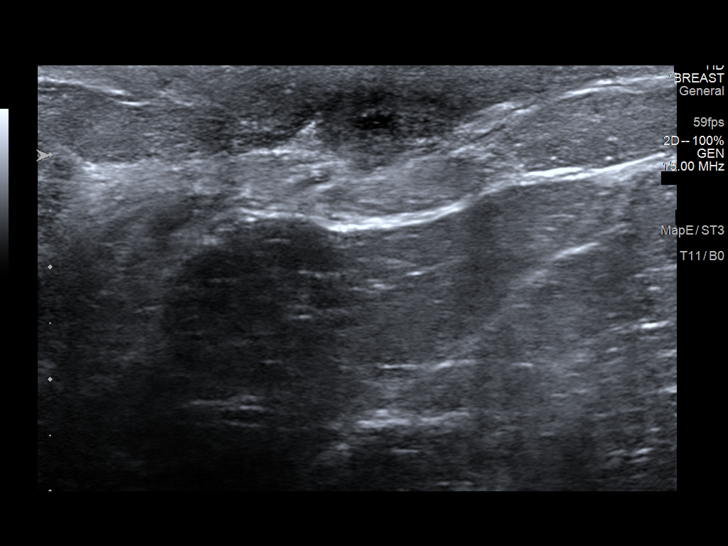
[im 7/10]
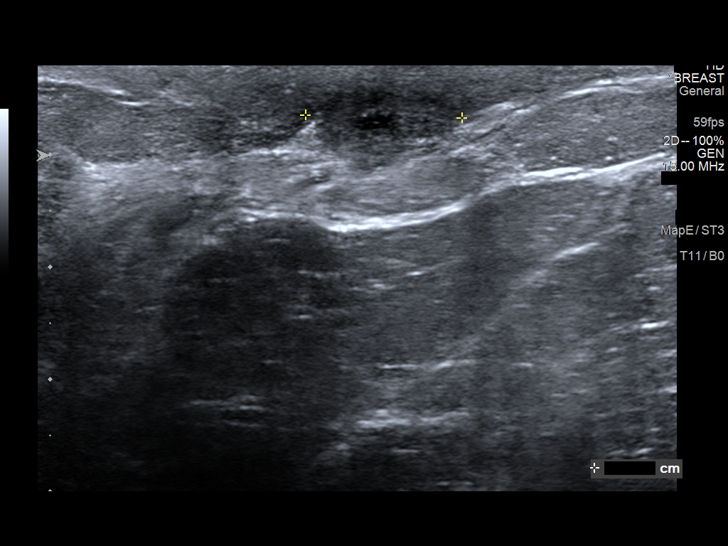
[im 8/10]
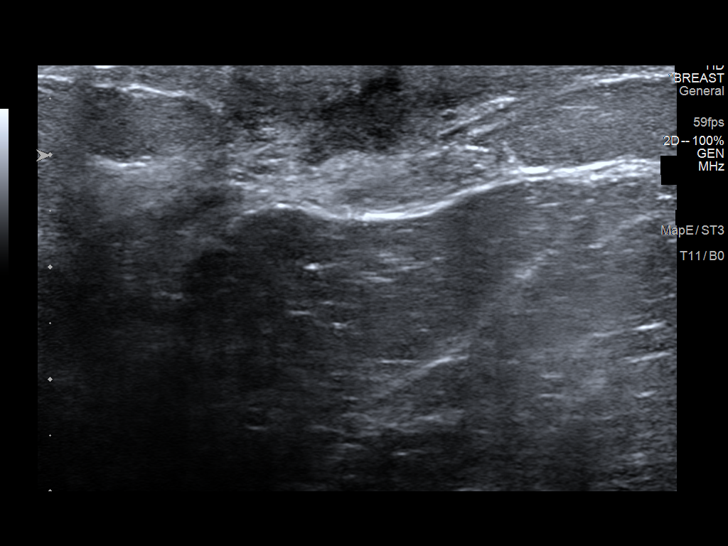
[im 9/10]
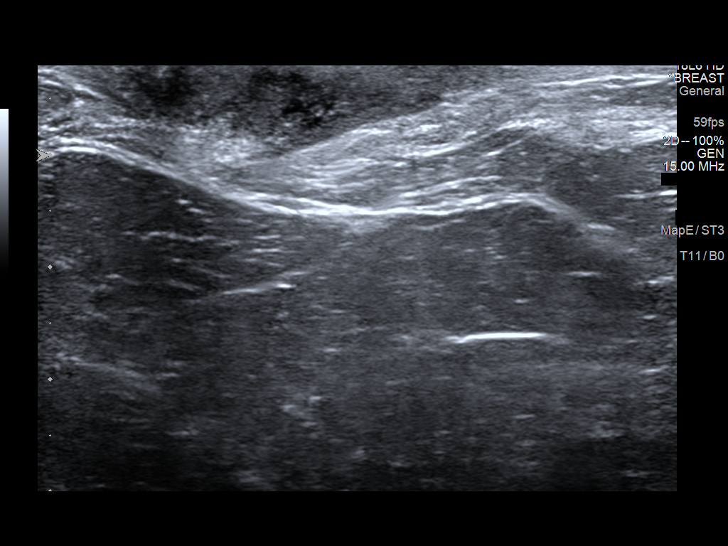
[im 10/10]
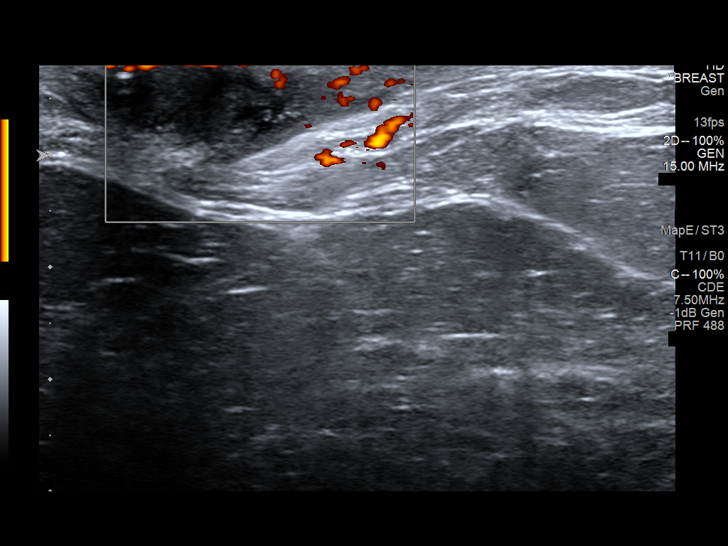

[10 of 10 positions shown; findings below may reference images not displayed]

FINDINGS: Physical examination of the right breast demonstrates
thickening/firmness immediately behind the nipple medially. No
discharge seen. Patient is mildly tender to palpation.

Targeted ultrasound was performed demonstrating a heterogeneous mass
at 2 o'clock retroareolar right breast likely representing
fluid/phlegmonous change measuring approximately 2.4 x 0.7 x 1.4 cm.
No drainable collections.
IMPRESSION: Persistent retroareolar right breast abscess/phlegmonous change,
mild in appearance with no drainable collections identified.

RECOMMENDATION:
The patient will be placed on additional course of antibiotics
(Bactrim) and instructed to return to the [REDACTED] in 2 weeks
for repeat right breast ultrasound to ensure continued
improvement/resolution of symptoms.

I have discussed the findings and recommendations with the patient.
Results were also provided in writing at the conclusion of the
visit. If applicable, a reminder letter will be sent to the patient
regarding the next appointment.

BI-RADS CATEGORY  3: Probably benign.

## 2019-04-27 IMAGING — US ULTRASOUND RIGHT BREAST LIMITED
1 series · 5 of 5 positions shown · non-contrast
Comparison: Previous exam(s).

CLINICAL DATA: 19-year-old female with presumed right breast
abscess post treatment with antibiotics. The patient states she
previously was able to express yellow thick discharge from her right
nipple, however this has resolved after treatment with antibiotics.
Her right breast surrounding the nipple is no longer firm and
tender.

EXAM:
ULTRASOUND OF THE RIGHT BREAST

[Series 1: ultrasound right breast limited · 0.05mm/px · 5 of 5 slices shown]
[im 1/5]
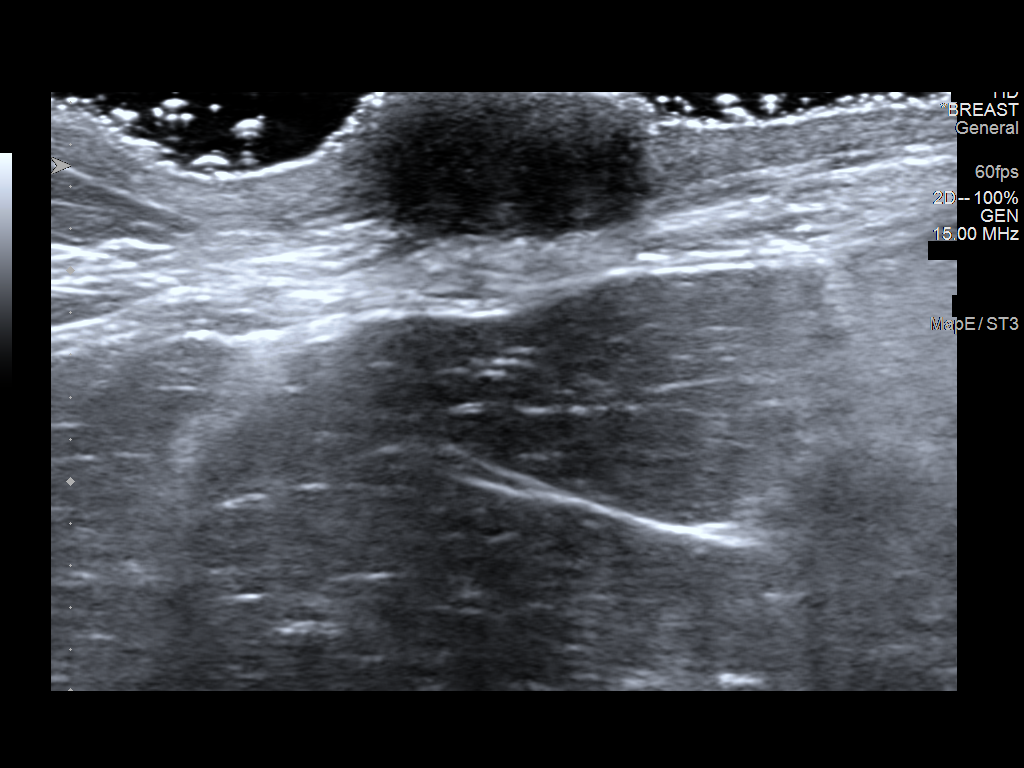
[im 2/5]
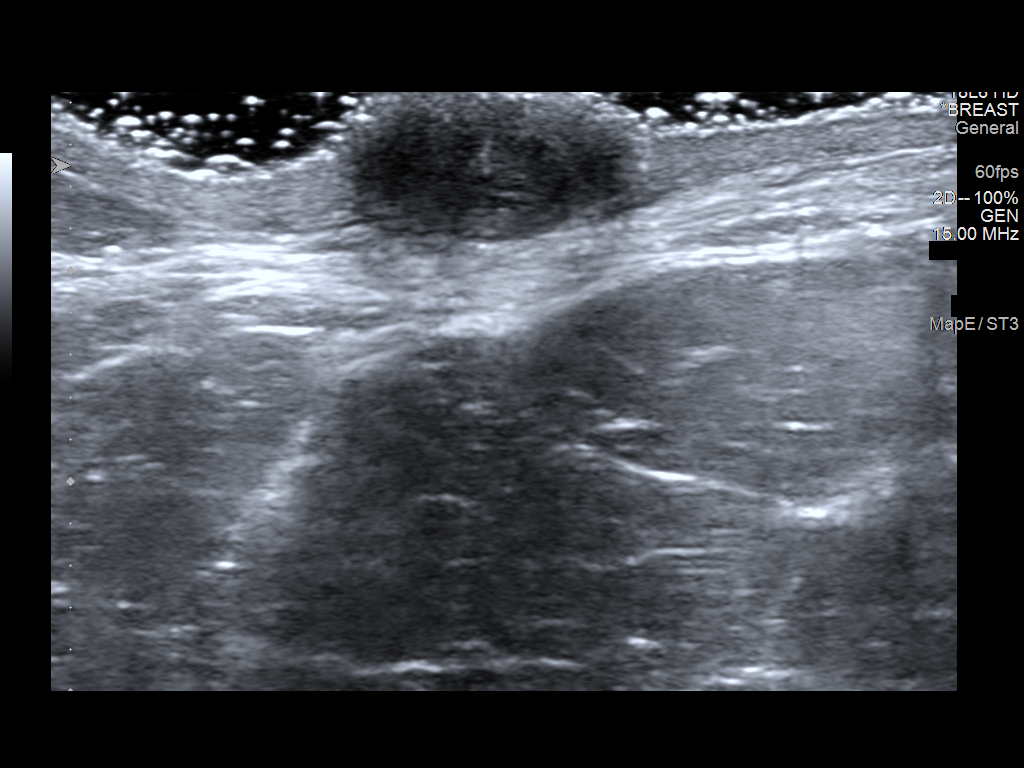
[im 3/5]
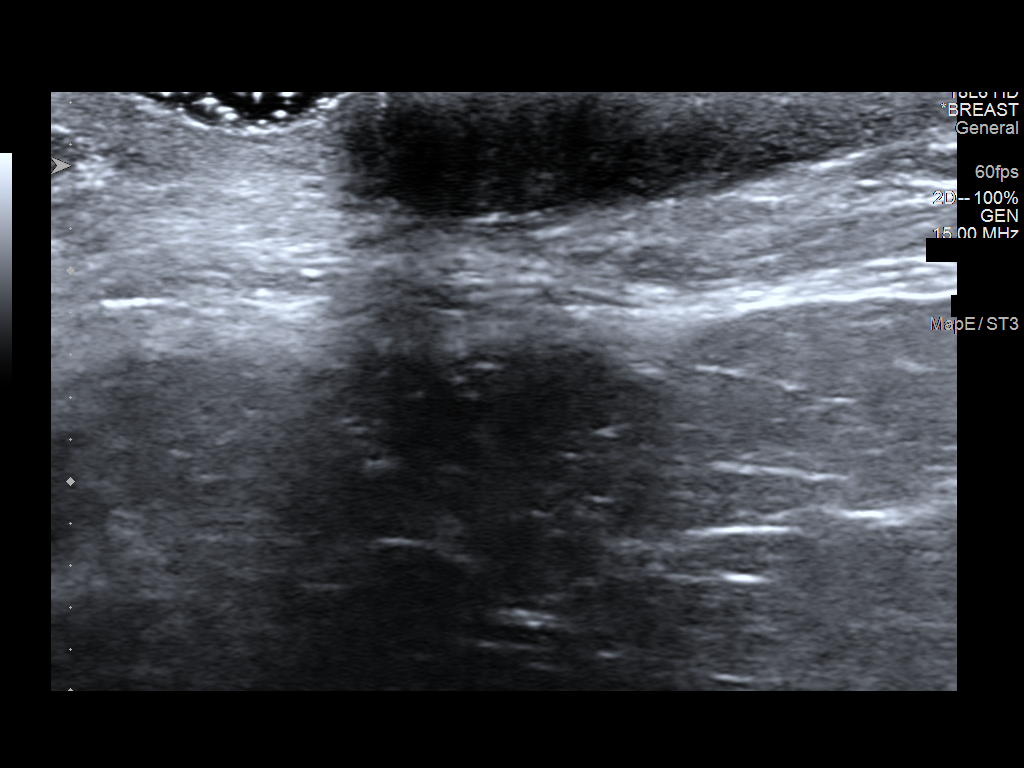
[im 4/5]
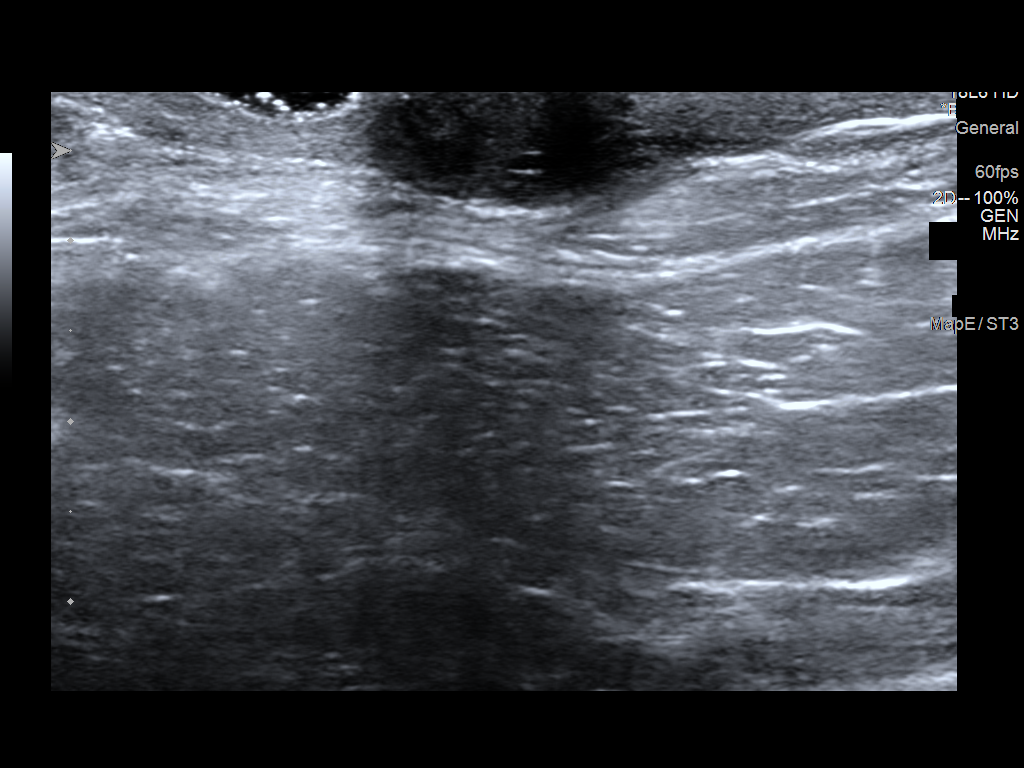
[im 5/5]
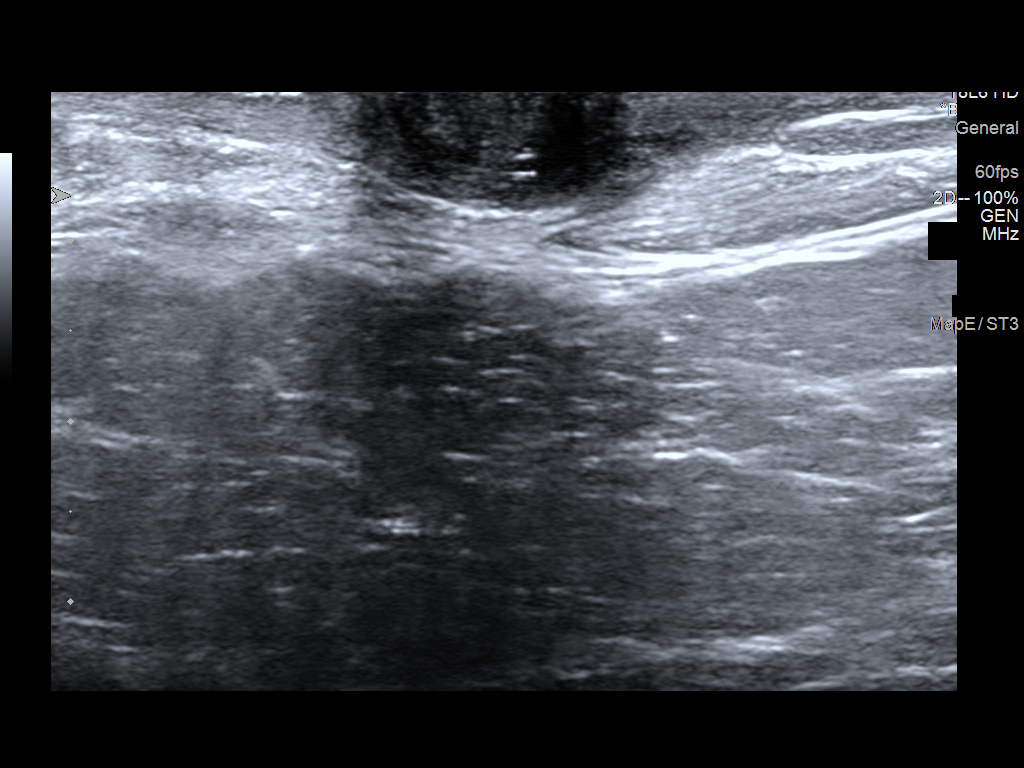

[5 of 5 positions shown; findings below may reference images not displayed]

FINDINGS: Physical examination of the periareolar/retroareolar right breast
does not reveal any palpable masses.

Targeted ultrasound of the right breast was performed. No suspicious
masses or abnormality seen in the retroareolar right breast. No
fluid collections identified, with the previously seen heterogeneous
mass at 2 o'clock retroareolar resolved.
IMPRESSION: Resolved right breast abscess.

RECOMMENDATION:
1. Clinical follow-up as needed for symptoms of right breast
infection. The symptoms appear to be nearly resolved and she has
been instructed to return if anything changes.

2. Screening mammogram at age 39 unless there are persistent or
intervening clinical concerns. (Code:[id number])

I have discussed the findings and recommendations with the patient.
Results were also provided in writing at the conclusion of the
visit. If applicable, a reminder letter will be sent to the patient
regarding the next appointment.

BI-RADS CATEGORY  1: Negative.

## 2019-11-17 ENCOUNTER — Ambulatory Visit: Attending: Internal Medicine

## 2019-11-17 ENCOUNTER — Ambulatory Visit: Admit: 2019-11-17 | Payer: MEDICAID | Attending: Internal Medicine

## 2019-11-17 ENCOUNTER — Ambulatory Visit: Admit: 2019-11-17 | Payer: MEDICAID

## 2019-11-17 DIAGNOSIS — R768 Other specified abnormal immunological findings in serum: Secondary | ICD-10-CM

## 2019-11-17 LAB — DIRECT COOMBS
DAT Poly: NEGATIVE
Direct Coombs Test: NEGATIVE

## 2019-11-17 MED ORDER — MELOXICAM 15 MG TAB
15 mg | ORAL_TABLET | Freq: Every day | ORAL | 2 refills | Status: AC
Start: 2019-11-17 — End: 2019-12-17

## 2019-11-17 NOTE — Progress Notes (Signed)
REASON FOR VISIT:   Ms.Vigilante is a 29 y.o. female with history of PCOS who is being referred to [facility] at the request of Dr. Lequita Halt, regarding a diagnosis of hand pain.    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 Told possible carpal tunnel syndrome. Shortly thereafter noticed popping in the left knee, sticking sensation.    Moved to [place] in March this year, now working in [employer] and [address]. On feet all day, not much heavy lifting but repetitive small movements all day.    No particular time of day. Tends to flare up, can last for seconds or flare for up to 2 hours at a time. Feels popping in the base of the thumb or index finger, feels she is increasingly numb in the fingers. Has had episodes where fingers would feel extremely stiff/swollen, decreased ability to move in the base of the thumb. Went to [facility] and injected in [facility] by description, says was advised to follow up with surgery though did not follow up with this.    Left knee is more painful with increased activity. Painful with going up and down stairs, deep bending. Lot of associated cracking and crepitus. Can give out on her, says fell once when trying to carry groceries.    For pain, takes ibuprofen or Aleve OTC 1 pill in the morning and sometimes an additional 1-1.5 tabs through the afternoon and evening. Soaking hands helps. Hasn't tried diclofenac gel to date.    In May was having another episode of hand pain, went to [facility], where screening labs were s/f ANA 1:80 homogenous.    Denies overt rashes. Feels she burns easily when at the beach in [place]. After her miscarriage early 2020 felt breasts grew, around which times she developed increased upper back and chest pain. Pains are crampy, and feels tight across the sternum. Stretching seems to help.    Is overdue for followup of  galactorrhea.        REVIEW OF SYSTEMS  A comprehensive review of systems was negative except for that written in the HPI.  A 10-point review of systems is per the new patient questionnaire, which has been reviewed extensively and scanned into the electronic medical record for future reference.  Review of systems is as above and is otherwise negative.    ALLERGIES  Patient has no known allergies.    MEDICATIONS  Current Outpatient Medications   Medication Sig   ??? ibuprofen (MOTRIN) 200 mg tablet Take 200 mg by mouth.     No current facility-administered medications for this visit.       PAST MEDICAL HISTORY  PCOS.    FAMILY HISTORY  Adopted, doesn't know parents' history other than mother has hypertension.    SOCIAL HISTORY  She  reports that she has been smoking. She has never used smokeless tobacco. She reports previous alcohol use. She reports that she does not use drugs.  Social History     Social History Narrative   ??? Not on file   G3P2A1 (loss at 9mo). 29yo and 29yo.    DATA  Visit Vitals  BP 106/74 (BP 1 Location: Left upper arm, BP Patient Position: Sitting)   Pulse 84   Temp 99.2 ??F (37.3 ??C) (Oral)   Resp 20   Wt 228 lb 8 oz (  103.6 kg)   LMP 10/14/2019   SpO2 97%    Body mass index is 39.22 kg/m??. No flowsheet data found.    General:  The patient is pleasant, obese, alert, and in no apparent distress.   Eyes: Sclera are anicteric. No conjunctival injection.   HEENT:  Oropharynx is clear. No oral ulcers. Adequate salivary pooling. No cervical or supraclavicular lymphadenopathy.   Lungs:  Clear to auscultation bilaterally, without wheeze or stridor. Normal respiratory effort.   Cor:  Regular rate and rhythm.  No murmur rub or gallop.   Abdomen: Soft, non-tender, without hepatomegaly or masses.   Extremities: No calf tenderness or edema. Warm and well perfused.   Skin:  No significant abnormalities. No petechial, purpuric, or psoriaform rashes   Neuro: +Tinel left wrist.  Musculoskeletal:    A comprehensive  musculoskeletal exam was performed for all joints of each upper and lower extremity and assessed for swelling, tenderness and range of motion. Results are documented as below:  Tenderness at base of bilateral CMCs, +Finkelstein's.  +joint line tenderness of left knee, mild bilateral anterior knee crepitus.  No evidence of synovitis in the small joints of the hands, wrists, shoulders, elbows, hips, knees or ankles.     Labs:  09/24/19: Cr 0.75, LFTs WNL, TSH WNL, RF neg, uric acid 4.6, CRP 4mg /L, ANA 1:80 homogenous      Imaging:   None relevant      ASSESSMENT AND PLAN  Ms. Din is a 29 y.o. female who presents for evaluation of left knee patellofemoral knee pain and early carpal tunnel syndrome particularly of the left hand, with low-titer ANA of doubtful clinical significance. She describes physically demanding jobs which are likely driving her use-related hand and knee pains.     Reviewed conservative management of carpal tunnel and patellofemoral arthralgia, encouraged continued weight loss for her dependent knee pain. Trialing a short course of meloxicam, which if helpful and her immunologic workup unrevealing she can continue through her PCP.    For completeness, screening for e/o lupus activity that could be driving her bilateral hand pain, though keeping a higher threshold to treat.     Patient Instructions   1. 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 Your knee pains also seem to be a combination of patellofemoral knee pain and early osteoarthritis. Keep working on weight loss, and try the meloxicam below. Also, the patellofemoral knee braces can be helpful during the day.    2. Like we discussed, about 1/5 women have a positive ANA titer like they found in your blood work. I'm checking a few other labs to make sure this isn't relevant, but there's a good chance that this is just an incidental  finding that doesn't have anything to do with your joint pain.    3. Labs at your earliest convenience.    4. For hand pain, try diclofenac gel (Voltaren gel) to use up to 4x/day, just use sparingly on the fingers that hurt you.    5. I'm trialing you on meloxicam 15mg  daily. Take this with food. Don't take ibuprofen or Aleve while you're taking this since they're all the same type of NSAID medication. If you do well with this, your PCP can continue it going forward.    6. Look for carpal tunnel wrist splints on 26 or your local pharmacy. Just wear these at night to give your  wrists a chance to heal.    7. Return in 1 month to review your progress and the lab results.         1. ANA positive  - Reviewed with her that nearly 20% of women in the [place] have a low-titer ANA, less than 10% of them develop clinical lupus.  - CBC WITH AUTOMATED DIFF; Future  - DSDNA ANTIBODY BY IFA, CRITHIDIA LUCILIAE, WITH REFLEX TO TITER; Future  - COMPLEMENT, C3 & C4; Future  - DIRECT COOMBS; Future  - URINALYSIS W/MICROSCOPIC; Future  - PROTEIN/CREATININE RATIO, URINE; Future    2. Patellofemoral pain syndrome of left knee  -Reviewed patellofemoral knee bracing  - meloxicam (MOBIC) 15 mg tablet; Take 1 Tablet by mouth daily for 30 days.  Dispense: 30 Tablet; Refill: 2    3. Primary osteoarthritis involving multiple joints  - meloxicam (MOBIC) 15 mg tablet; Take 1 Tablet by mouth daily for 30 days.  Dispense: 30 Tablet; Refill: 2    4. Carpal tunnel syndrome on left  - meloxicam (MOBIC) 15 mg tablet; Take 1 Tablet by mouth daily for 30 days.  Dispense: 30 Tablet; Refill: 2  -Trial nocturnal wrist splints      Orders Placed This Encounter   ??? CBC WITH AUTOMATED DIFF   ??? DSDNA ANTIBODY BY IFA, CRITHIDIA LUCILIAE, WITH REFLEX TO TITER   ??? COMPLEMENT, C3 & C4   ??? URINALYSIS W/MICROSCOPIC   ??? PROTEIN/CREATININE RATIO, URINE   ??? DIRECT COOMBS   ??? DISCONTD: ibuprofen (MOTRIN) 200 mg tablet   ??? meloxicam (MOBIC) 15 mg tablet       Medications:  I have discontinued [name] Wiland's ibuprofen. I am also having her start on meloxicam.    Follow up: Return in about 4 weeks (around 12/15/2019).    Face to face time: 50 minutes  Note preparation and records review day of service: 15 minutes  Total provider time day of service: 65 minutes    Gomez Cleverly, MD    Adult Rheumatology   [facility]  A [facility] of [facility]  [address] [place], [place] [address]   Phone [phone]  Fax [phone]

## 2019-11-18 LAB — COMPLEMENT, C3 & C4
Complement C3: 119 mg/dL (ref 82–167)
Complement C3: 119 mg/dL (ref 82–167)
Complement C4: 26 mg/dL (ref 12–38)
Complement C4: 26 mg/dL (ref 12–38)

## 2019-11-19 LAB — DSDNA AB BY IFA, CRITHIDIA LUCILIAE, W RFLX TO TITER: dsDNA Crithidia luciliae IFA: NEGATIVE

## 2019-11-20 LAB — CBC WITH AUTO DIFFERENTIAL
Basophils %: 0 % (ref 0–1)
Basophils Absolute: 0 10*3/uL (ref 0.0–0.1)
Eosinophils %: 3 % (ref 0–7)
Eosinophils Absolute: 0.2 10*3/uL (ref 0.0–0.4)
Granulocyte Absolute Count: 0 10*3/uL (ref 0.00–0.04)
Hematocrit: 40.5 % (ref 35.0–47.0)
Hemoglobin: 12.8 g/dL (ref 11.5–16.0)
Immature Granulocytes: 0 % (ref 0.0–0.5)
Lymphocytes %: 21 % (ref 12–49)
Lymphocytes Absolute: 1.5 10*3/uL (ref 0.8–3.5)
MCH: 29.4 PG (ref 26.0–34.0)
MCHC: 31.6 g/dL (ref 30.0–36.5)
MCV: 93.1 FL (ref 80.0–99.0)
MPV: 11.7 FL (ref 8.9–12.9)
Monocytes %: 8 % (ref 5–13)
Monocytes Absolute: 0.6 10*3/uL (ref 0.0–1.0)
NRBC Absolute: 0 10*3/uL (ref 0.00–0.01)
Neutrophils %: 68 % (ref 32–75)
Neutrophils Absolute: 4.9 10*3/uL (ref 1.8–8.0)
Nucleated RBCs: 0 PER 100 WBC
Platelets: 170 10*3/uL (ref 150–400)
RBC: 4.35 M/uL (ref 3.80–5.20)
RDW: 13.6 % (ref 11.5–14.5)
WBC: 7.3 10*3/uL (ref 3.6–11.0)

## 2019-11-20 LAB — CBC WITH AUTOMATED DIFF
ABS. BASOPHILS: 0 10*3/uL (ref 0.0–0.1)
ABS. EOSINOPHILS: 0.2 10*3/uL (ref 0.0–0.4)
ABS. IMM. GRANS.: 0 10*3/uL (ref 0.00–0.04)
ABS. LYMPHOCYTES: 1.5 10*3/uL (ref 0.8–3.5)
ABS. MONOCYTES: 0.6 10*3/uL (ref 0.0–1.0)
ABS. NEUTROPHILS: 4.9 10*3/uL (ref 1.8–8.0)
ABSOLUTE NRBC: 0 10*3/uL (ref 0.00–0.01)
BASOPHILS: 0 % (ref 0–1)
EOSINOPHILS: 3 % (ref 0–7)
HCT: 40.5 % (ref 35.0–47.0)
HGB: 12.8 g/dL (ref 11.5–16.0)
IMMATURE GRANULOCYTES: 0 % (ref 0.0–0.5)
LYMPHOCYTES: 21 % (ref 12–49)
MCH: 29.4 PG (ref 26.0–34.0)
MCHC: 31.6 g/dL (ref 30.0–36.5)
MCV: 93.1 FL (ref 80.0–99.0)
MONOCYTES: 8 % (ref 5–13)
MPV: 11.7 FL (ref 8.9–12.9)
NEUTROPHILS: 68 % (ref 32–75)
NRBC: 0 PER 100 WBC
PLATELET: 170 10*3/uL (ref 150–400)
RBC: 4.35 M/uL (ref 3.80–5.20)
RDW: 13.6 % (ref 11.5–14.5)
WBC: 7.3 10*3/uL (ref 3.6–11.0)

## 2019-12-18 ENCOUNTER — Ambulatory Visit: Attending: Internal Medicine

## 2019-12-18 ENCOUNTER — Ambulatory Visit: Admit: 2019-12-18 | Payer: MEDICAID | Attending: Internal Medicine

## 2019-12-18 DIAGNOSIS — M222X2 Patellofemoral disorders, left knee: Secondary | ICD-10-CM

## 2019-12-18 NOTE — Progress Notes (Signed)
 Chief Complaint   Patient presents with   . Joint Pain     hands     1. Have you been to the ER, urgent care clinic since your last visit?  Hospitalized since your last visit?No    2. Have you seen or consulted any other health care providers outside of the [facility] since your last visit?  Include any pap smears or colon screening. No

## 2019-12-18 NOTE — Progress Notes (Signed)
REASON FOR VISIT:   Ms.Bolender is a 29 y.o. female with history of PCOS who is returning for initial followup of ANA+ and patellofemoral knee pain.    HISTORY OF PRESENT ILLNESS    Overall feels about the same. Patellofemoral knee braces have reduced instability of knees and soreness through the day. Diclofenac gel hasn't made a big difference for the knee.    Wearing wrist splint for thumb which has been helpful. Splint seems to dry out skin, very pruritic though no rash. OT at work has been helping her with exercises.    Perhaps some improvement with meloxicam over the Aleve, but feels former is harder on the stomach.    Some eye dryness and blurring at the end of the day, no redness or photophobia, attributes to fatigue.    No breathing complaints. No leg swelling.    Feels motivated toward weight loss. Intends to restrict calories and increase activity.        Disease History:  Initial visit 11/17/19.   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 Told possible carpal tunnel syndrome. Shortly thereafter noticed popping in the left knee, sticking sensation.  Moved to [place] in March this year, now working in [employer] and [address]. On feet all day, not much heavy lifting but repetitive small movements all day.  No particular time of day. Tends to flare up, can last for seconds or flare for up to 2 hours at a time. Feels popping in the base of the thumb or index finger, feels she is increasingly numb in the fingers. Has had episodes where fingers would feel extremely stiff/swollen, decreased ability to move in the base of the thumb. Went to [facility] and injected in [facility] by description, says was advised to follow up with surgery though did not follow up with this.  Left knee is more painful with increased activity. Painful with going up and down stairs, deep bending. Lot of associated cracking and crepitus.  Can give out on her, says fell once when trying to carry groceries.  For pain, takes ibuprofen or Aleve OTC 1 pill in the morning and sometimes an additional 1-1.5 tabs through the afternoon and evening. Soaking hands helps. Hasn't tried diclofenac gel to date.  In May was having another episode of hand pain, went to [facility], where screening labs were s/f ANA 1:80 homogenous.  Denies overt rashes. Feels she burns easily when at the beach in [place]. After her miscarriage early 2020 felt breasts grew, around which times she developed increased upper back and chest pain. Pains are crampy, and feels tight across the sternum. Stretching seems to help.  Is overdue for followup of galactorrhea.        REVIEW OF SYSTEMS  A comprehensive review of systems was negative except for that written in the HPI.  A 10-point review of systems is per the new patient questionnaire, which has been reviewed extensively and scanned into the electronic medical record for future reference.  Review of systems is as above and is otherwise negative.    ALLERGIES  Patient has no known allergies.    MEDICATIONS  No current outpatient medications on file.     No current facility-administered medications for this visit.       PAST MEDICAL HISTORY  PCOS.    FAMILY HISTORY  Adopted, doesn't know parents' history other than mother  has hypertension.    SOCIAL HISTORY  She  reports that she has been smoking. She has never used smokeless tobacco. She reports previous alcohol use. She reports that she does not use drugs.  Social History     Social History Narrative   ??? Not on file   G3P2A1 (loss at 61mo). 29yo and 29yo.    DATA  Visit Vitals  BP 106/71 (BP 1 Location: Left upper arm, BP Patient Position: Sitting)   Pulse 79   Temp 98.9 ??F (37.2 ??C) (Oral)   Resp 20   Ht 5\' 4"  (1.626 m)   Wt 221 lb 9.6 oz (100.5 kg)   LMP 12/17/2019   SpO2 98%   BMI 38.04 kg/m??    Body mass index is 38.04 kg/m??. No flowsheet data found.    General:  The patient is pleasant,  obese, alert, and in no apparent distress.   Eyes: Sclera are anicteric. No conjunctival injection.   HEENT:  Oropharynx is clear. No oral ulcers. Adequate salivary pooling. No cervical or supraclavicular lymphadenopathy.   Lungs:  Clear to auscultation bilaterally, without wheeze or stridor. Normal respiratory effort.   Cor:  Regular rate and rhythm.  No murmur rub or gallop.   Abdomen: Soft, non-tender, without hepatomegaly or masses.   Extremities: No calf tenderness or edema. Warm and well perfused.   Skin:  No significant abnormalities. No petechial, purpuric, or psoriaform rashes   Neuro: +Tinel left wrist.  Musculoskeletal:    A comprehensive musculoskeletal exam was performed for all joints of each upper and lower extremity and assessed for swelling, tenderness and range of motion. Results are documented as below:  Unchanged tenderness at base of bilateral CMCs, +Finkelstein's.  +joint line tenderness of left knee, mild bilateral anterior knee crepitus.  No evidence of synovitis in the small joints of the hands, wrists, shoulders, elbows, hips, knees or ankles.     Labs:  11/17/19: C3 WNL, C4 WNL, CBC WNL, dsDNA neg, direct Coombs neg  09/24/19: Cr 0.75, LFTs WNL, TSH WNL, RF neg, uric acid 4.6, CRP 4mg /L, ANA 1:80 homogenous       Imaging:   None relevant      ASSESSMENT AND PLAN  Ms. Schweers is a 29 y.o. female who presents for evaluation of left knee patellofemoral knee pain and early carpal tunnel syndrome particularly of the left hand, with low-titer ANA of doubtful clinical significance and reassuringly normal complements, dsDNA, Coombs.     Reviewed conservative management and provided reassurance against the role for immunosuppression. Encouraged her to notify Blain Pais with new rashes or joint swelling for which we can reevaluate her for development of a connective tissue disease.    Patient Instructions   1. Your labs look good, no signs here of lupus. I think your hand pains are related to tendonitis and  early osteoarthritis with overuse, and knee pains from patellofemoral arthralgia.    2. Continue the knee braces with activity, and spica splint when using your hands. I'm always happy to refer you to PT to work on knees and low back as needed.    3. For breakthrough pain, OK to use meloxicam if you really need it, but if it's giving you stomach upset then stick with tylenol up to 1000mg  3x/day.    4. Let me know if you develop new rashes, joint swelling, leg swelling, or other concerns and I can make sure nothing new is developing from an autoimmune standpoint. You're considered a return patient  for up to 3 years. IN the meantime, reach out to [facility] to establish with a local primary care physician.      1. Patellofemoral pain syndrome of left knee  -Knee bracing, patellar taping  -NSAIDs OTC as needed    2. ANA positive  -Reevaluation as needed for new symptoms in the future    3. Carpal tunnel syndrome on left  -Reviewed wrist splints at night    Medications: Dawnyel Leven does not currently have medications on file.    Follow up: Return if symptoms worsen or fail to improve.    Face to face time: 15 minutes  Note preparation and records review day of service: 20 minutes  Total provider time day of service: 35 minutes    Gomez Cleverly, MD    Adult Rheumatology   [facility]  A Part of [facility]  [address] [place], [place] [address]   Phone [phone]  Fax [phone]

## 2020-04-11 ENCOUNTER — Inpatient Hospital Stay
Admit: 2020-04-11 | Discharge: 2020-04-11 | Disposition: A | Discharge status: Home / Self Care | Payer: MEDICAID | Attending: Emergency Medicine

## 2020-04-11 ENCOUNTER — Emergency Department: Admit: 2020-04-11 | Payer: MEDICAID

## 2020-04-11 DIAGNOSIS — J189 Pneumonia, unspecified organism: Secondary | ICD-10-CM

## 2020-04-11 LAB — CBC WITH AUTO DIFFERENTIAL
Basophils %: 0 % (ref 0–1)
Basophils Absolute: 0 10*3/uL (ref 0.0–0.1)
Eosinophils %: 1 % (ref 0–7)
Eosinophils Absolute: 0.1 10*3/uL (ref 0.0–0.4)
Granulocyte Absolute Count: 0 10*3/uL (ref 0.00–0.04)
Hematocrit: 40.3 % (ref 35.0–47.0)
Hemoglobin: 13 g/dL (ref 11.5–16.0)
Immature Granulocytes: 0 % (ref 0.0–0.5)
Lymphocytes %: 13 % (ref 12–49)
Lymphocytes Absolute: 0.8 10*3/uL (ref 0.8–3.5)
MCH: 29.3 PG (ref 26.0–34.0)
MCHC: 32.3 g/dL (ref 30.0–36.5)
MCV: 91 FL (ref 80.0–99.0)
MPV: 11.4 FL (ref 8.9–12.9)
Monocytes %: 15 % — ABNORMAL HIGH (ref 5–13)
Monocytes Absolute: 0.9 10*3/uL (ref 0.0–1.0)
NRBC Absolute: 0 10*3/uL (ref 0.00–0.01)
Neutrophils %: 71 % (ref 32–75)
Neutrophils Absolute: 4 10*3/uL (ref 1.8–8.0)
Nucleated RBCs: 0 PER 100 WBC
Platelets: 145 10*3/uL — ABNORMAL LOW (ref 150–400)
RBC: 4.43 M/uL (ref 3.80–5.20)
RDW: 13 % (ref 11.5–14.5)
WBC: 5.8 10*3/uL (ref 3.6–11.0)

## 2020-04-11 LAB — URINALYSIS W/ REFLEX CULTURE
Bilirubin, Urine: NEGATIVE
Bilirubin: NEGATIVE
Blood, Urine: NEGATIVE
Blood: NEGATIVE
Glucose, Ur: NEGATIVE mg/dL
Glucose: NEGATIVE mg/dL
Ketone: NEGATIVE mg/dL
Ketones, Urine: NEGATIVE mg/dL
Leukocyte Esterase, Urine: NEGATIVE
Leukocyte Esterase: NEGATIVE
Nitrite, Urine: NEGATIVE
Nitrites: NEGATIVE
Protein, UA: NEGATIVE mg/dL
Protein: NEGATIVE mg/dL
Specific Gravity, UA: 1.009 (ref 1.003–1.030)
Specific gravity: 1.009 (ref 1.003–1.030)
Urobilinogen, UA, POCT: 1 EU/dL (ref 0.2–1.0)
Urobilinogen: 1 EU/dL (ref 0.2–1.0)
pH (UA): 7.5 (ref 5.0–8.0)
pH, UA: 7.5 (ref 5.0–8.0)

## 2020-04-11 LAB — COMPREHENSIVE METABOLIC PANEL
ALT: 22 U/L (ref 12–78)
AST: 16 U/L (ref 15–37)
Albumin/Globulin Ratio: 1 — ABNORMAL LOW (ref 1.1–2.2)
Albumin: 3.4 g/dL — ABNORMAL LOW (ref 3.5–5.0)
Alkaline Phosphatase: 67 U/L (ref 45–117)
Anion Gap: 4 mmol/L — ABNORMAL LOW (ref 5–15)
BUN: 7 MG/DL (ref 6–20)
Bun/Cre Ratio: 9 — ABNORMAL LOW (ref 12–20)
CO2: 27 mmol/L (ref 21–32)
Calcium: 8.9 MG/DL (ref 8.5–10.1)
Chloride: 105 mmol/L (ref 97–108)
Creatinine: 0.75 MG/DL (ref 0.55–1.02)
EGFR IF NonAfrican American: >60 mL/min/{1.73_m2} (ref >60)
GFR African American: >60 mL/min/{1.73_m2} (ref >60)
Globulin: 3.5 g/dL (ref 2.0–4.0)
Glucose: 86 mg/dL (ref 65–100)
Potassium: 3.6 mmol/L (ref 3.5–5.1)
Sodium: 136 mmol/L (ref 136–145)
Total Bilirubin: 0.4 MG/DL (ref 0.2–1.0)
Total Protein: 6.9 g/dL (ref 6.4–8.2)

## 2020-04-11 LAB — INFLUENZA A+B VIRAL AGS
Flu A Antigen: NEGATIVE
Influenza A Antigen: NEGATIVE
Influenza B Antigen: NEGATIVE
Influenza B Antigen: NEGATIVE

## 2020-04-11 LAB — CBC WITH AUTOMATED DIFF
ABS. BASOPHILS: 0 10*3/uL (ref 0.0–0.1)
ABS. EOSINOPHILS: 0.1 10*3/uL (ref 0.0–0.4)
ABS. IMM. GRANS.: 0 10*3/uL (ref 0.00–0.04)
ABS. LYMPHOCYTES: 0.8 10*3/uL (ref 0.8–3.5)
ABS. MONOCYTES: 0.9 10*3/uL (ref 0.0–1.0)
ABS. NEUTROPHILS: 4 10*3/uL (ref 1.8–8.0)
ABSOLUTE NRBC: 0 10*3/uL (ref 0.00–0.01)
BASOPHILS: 0 % (ref 0–1)
EOSINOPHILS: 1 % (ref 0–7)
HCT: 40.3 % (ref 35.0–47.0)
HGB: 13 g/dL (ref 11.5–16.0)
IMMATURE GRANULOCYTES: 0 % (ref 0.0–0.5)
LYMPHOCYTES: 13 % (ref 12–49)
MCH: 29.3 PG (ref 26.0–34.0)
MCHC: 32.3 g/dL (ref 30.0–36.5)
MCV: 91 FL (ref 80.0–99.0)
MONOCYTES: 15 % — ABNORMAL HIGH (ref 5–13)
MPV: 11.4 FL (ref 8.9–12.9)
NEUTROPHILS: 71 % (ref 32–75)
NRBC: 0 PER 100 WBC
PLATELET: 145 10*3/uL — ABNORMAL LOW (ref 150–400)
RBC: 4.43 M/uL (ref 3.80–5.20)
RDW: 13 % (ref 11.5–14.5)
WBC: 5.8 10*3/uL (ref 3.6–11.0)

## 2020-04-11 LAB — METABOLIC PANEL, COMPREHENSIVE
A-G Ratio: 1 — ABNORMAL LOW (ref 1.1–2.2)
ALT (SGPT): 22 U/L (ref 12–78)
AST (SGOT): 16 U/L (ref 15–37)
Albumin: 3.4 g/dL — ABNORMAL LOW (ref 3.5–5.0)
Alk. phosphatase: 67 U/L (ref 45–117)
Anion gap: 4 mmol/L — ABNORMAL LOW (ref 5–15)
BUN/Creatinine ratio: 9 — ABNORMAL LOW (ref 12–20)
BUN: 7 MG/DL (ref 6–20)
Bilirubin, total: 0.4 MG/DL (ref 0.2–1.0)
CO2: 27 mmol/L (ref 21–32)
Calcium: 8.9 MG/DL (ref 8.5–10.1)
Chloride: 105 mmol/L (ref 97–108)
Creatinine: 0.75 MG/DL (ref 0.55–1.02)
GFR est AA: >60 mL/min/{1.73_m2} (ref >60)
GFR est non-AA: >60 mL/min/{1.73_m2} (ref >60)
Globulin: 3.5 g/dL (ref 2.0–4.0)
Glucose: 86 mg/dL (ref 65–100)
Potassium: 3.6 mmol/L (ref 3.5–5.1)
Protein, total: 6.9 g/dL (ref 6.4–8.2)
Sodium: 136 mmol/L (ref 136–145)

## 2020-04-11 MED ORDER — SODIUM CHLORIDE 0.9% BOLUS IV
0.9 % | INTRAVENOUS | Status: AC
Start: 2020-04-11 — End: 2020-04-11
  Administered 2020-04-11: 16:00:00 via INTRAVENOUS

## 2020-04-11 MED ORDER — ONDANSETRON 4 MG TAB, RAPID DISSOLVE
4 mg | ORAL_TABLET | Freq: Three times a day (TID) | ORAL | 0 refills | Status: AC | PRN
Start: 2020-04-11 — End: ?

## 2020-04-11 MED ORDER — CEFTRIAXONE 1 GRAM SOLUTION FOR INJECTION
1 gram | INTRAMUSCULAR | Status: AC
Start: 2020-04-11 — End: 2020-04-11
  Administered 2020-04-11: 17:00:00 via INTRAVENOUS

## 2020-04-11 MED ORDER — ACETAMINOPHEN 500 MG TAB
500 mg | Freq: Once | ORAL | Status: AC
Start: 2020-04-11 — End: 2020-04-11
  Administered 2020-04-11: 16:00:00 via ORAL

## 2020-04-11 MED ORDER — DOXYCYCLINE HYCLATE 100 MG TAB
100 mg | ORAL_TABLET | Freq: Two times a day (BID) | ORAL | 0 refills | Status: AC
Start: 2020-04-11 — End: 2020-04-21

## 2020-04-11 MED ORDER — KETOROLAC TROMETHAMINE 30 MG/ML INJECTION
30 mg/mL (1 mL) | INTRAMUSCULAR | Status: AC
Start: 2020-04-11 — End: 2020-04-11
  Administered 2020-04-11: 16:00:00 via INTRAVENOUS

## 2020-04-11 MED ORDER — DIPHENHYDRAMINE HCL 50 MG/ML IJ SOLN
50 mg/mL | INTRAMUSCULAR | Status: AC
Start: 2020-04-11 — End: 2020-04-11
  Administered 2020-04-11: 16:00:00 via INTRAVENOUS

## 2020-04-11 MED ORDER — LIDOCAINE-EPINEPHRINE 1 %-1:100,000 IJ SOLN
1 %-:00,000 | Freq: Once | INTRAMUSCULAR | Status: DC
Start: 2020-04-11 — End: 2020-04-11

## 2020-04-11 MED ORDER — BUTALBITAL-ACETAMINOPHEN-CAFFEINE 50 MG-300 MG-40 MG CAPSULE
50-300-40 mg | ORAL_CAPSULE | ORAL | 0 refills | Status: AC | PRN
Start: 2020-04-11 — End: ?

## 2020-04-11 MED ORDER — BENZONATATE 200 MG CAP
200 mg | ORAL_CAPSULE | Freq: Three times a day (TID) | ORAL | 0 refills | Status: AC | PRN
Start: 2020-04-11 — End: 2020-04-18

## 2020-04-11 MED ORDER — ONDANSETRON (PF) 4 MG/2 ML INJECTION
4 mg/2 mL | INTRAMUSCULAR | Status: AC
Start: 2020-04-11 — End: 2020-04-11
  Administered 2020-04-11: 16:00:00 via INTRAVENOUS

## 2020-04-11 MED FILL — SODIUM CHLORIDE 0.9 % IV: INTRAVENOUS | Qty: 1000

## 2020-04-11 MED FILL — ACETAMINOPHEN 500 MG TAB: 500 mg | ORAL | Qty: 2

## 2020-04-11 MED FILL — DIPHENHYDRAMINE HCL 50 MG/ML IJ SOLN: 50 mg/mL | INTRAMUSCULAR | Qty: 1

## 2020-04-11 MED FILL — CEFTRIAXONE 1 GRAM SOLUTION FOR INJECTION: 1 gram | INTRAMUSCULAR | Qty: 1

## 2020-04-11 MED FILL — ONDANSETRON (PF) 4 MG/2 ML INJECTION: 4 mg/2 mL | INTRAMUSCULAR | Qty: 2

## 2020-04-11 MED FILL — KETOROLAC TROMETHAMINE 30 MG/ML INJECTION: 30 mg/mL (1 mL) | INTRAMUSCULAR | Qty: 1

## 2020-04-11 NOTE — ED Provider Notes (Signed)
ED Provider Notes by Garrel Ridgel, PA at 04/11/20 1245                Author: Garrel Ridgel, [place]  Service: Emergency Medicine  Author Type: Physician Assistant       Filed: 05/24/20 1833  Date of Service: 04/11/20 1245  Status: Attested           Editor: Garrel Ridgel, PA (Physician Assistant)  Cosigner: Mellody Drown, MD at 05/24/20 2226          Attestation signed by Mellody Drown, MD at 05/24/20 2226          The patient was seen, evaluated, examined, treated including performance of procedures, and disposition by the [facility] independently. I was available for consultation in the ED, if needed, pursuant  to the Code of [place]. The chart was received for cosigning after patient disposition.       Mellody Drown, MD                                    EMERGENCY DEPARTMENT HISTORY AND PHYSICAL EXAM           Date: 04/11/2020   Patient Name: Felicia Wilkins        History of Presenting Illness          Chief Complaint       Patient presents with        ?  Generalized Body Aches             pt reports body aches and headaches x 3 days         ?  Headache           History Provided By: Patient      HPI: Felicia Wilkins , 29 y.o. female presents ambulatory  to the Emergency Dept with c/o 3 day h/o generalized body aches and headache. She states she was tested by her PCP for COVID approx 10 days ago and was negative. She does not believe she has COVID but a virus of some sort. She has had mild cough, but  the worst symptom is her headache which she rates as severe. She denied h/o chronic sinus infections. No head injury. She denied neck stiffness or rash. No visual changes, confusion, weakness, or problems with speech/ambulation. She denied N/V/D but states  she has not been eating/drinking as her norm because she has felt so poorly. She is a smoker.  Pt is o/w healthy without fever, chills, ST, shortness of breath, chest pain, rash/lesion.            There are no other complaints, changes, or physical findings at  this time.      PCP: None        Current Outpatient Medications          Medication  Sig  Dispense  Refill           ?  butalbital-acetaminophen-caff (Fioricet) 50-300-40 mg per capsule  Take 1 Capsule by mouth every four (4) hours as needed for Headache for up to 12 doses.  12 Capsule  0           ?  ondansetron (Zofran ODT) 4 mg disintegrating tablet  Take 1 Tablet by mouth every eight (8) hours as needed for Nausea.  10 Tablet  0     ?  albuterol (PROVENTIL HFA, VENTOLIN HFA, PROAIR HFA)  90 mcg/actuation inhaler  Take 2 Puffs by inhalation every four (4) hours as needed for Wheezing.  1 Each  0           ?  predniSONE (STERAPRED DS) 10 mg dose pack  As directed  21 Tablet  0             Past History        Past Medical History:   History reviewed. No pertinent past medical history.      Past Surgical History:   No past surgical history on file.      Family History:   History reviewed. No pertinent family history.      Social History:     Social History          Tobacco Use         ?  Smoking status:  Current Every Day Smoker     ?  Smokeless tobacco:  Never Used       Substance Use Topics         ?  Alcohol use:  Not Currently         ?  Drug use:  Never           Allergies:     Allergies        Allergen  Reactions         ?  Phenergan [Promethazine]  Nausea and Vomiting                Review of Systems     Review of Systems    Constitutional: Positive for appetite change and fatigue . Negative for chills and fever.    HENT: Negative for congestion, rhinorrhea and sore throat.     Eyes: Negative for photophobia, pain and visual disturbance.    Respiratory: Positive for cough. Negative for shortness of breath.     Cardiovascular: Negative for chest pain and palpitations.    Gastrointestinal: Negative for abdominal pain, diarrhea, nausea and vomiting.    Endocrine: Negative for polydipsia, polyphagia and polyuria.    Genitourinary: Negative for dysuria and hematuria.    Musculoskeletal: Positive for myalgias.  Negative for neck pain and neck stiffness.    Skin: Negative for rash and wound.    Allergic/Immunologic: Negative for food allergies and immunocompromised state.    Neurological: Positive for headaches. Negative for dizziness, syncope, speech difficulty, weakness and light-headedness.    Hematological: Negative for adenopathy. Does not bruise/bleed easily.    Psychiatric/Behavioral: Negative for agitation and confusion.    All other systems reviewed and are negative.           Physical Exam     Physical Exam   Vitals and nursing note reviewed.   Constitutional:        General: She is not in acute distress.     Appearance: Normal appearance. She is well-developed and normal weight. She is ill-appearing . She is not toxic-appearing or diaphoretic.   HENT :       Head: Normocephalic and atraumatic.      Right Ear: Tympanic membrane, ear canal and external ear normal. There is no impacted cerumen.      Left Ear: Tympanic membrane, ear canal and external ear normal. There is no impacted cerumen.       Nose: Nose normal. No congestion or rhinorrhea.      Mouth/Throat:      Mouth: Mucous membranes are moist.      Pharynx: No oropharyngeal  exudate.   Eyes:       General: No scleral icterus.        Right eye: No discharge.         Left eye: No discharge.      Extraocular Movements: Extraocular movements intact.      Conjunctiva/sclera: Conjunctivae normal.      Pupils: Pupils are equal,  round, and reactive to light.   Neck :       Thyroid: No thyromegaly.      Vascular: No JVD.      Trachea: No tracheal deviation.    Cardiovascular:       Rate and Rhythm: Normal rate and regular rhythm.      Pulses: Normal pulses.      Heart sounds: Normal heart sounds.    Pulmonary:       Effort: Pulmonary effort is normal. No respiratory distress.      Breath sounds: Normal breath sounds. No stridor. No wheezing or  rhonchi.      Comments: No appreciable wheezes or rhonchi, mild to mod nonproductive cough noted during exam  Chest:        Chest wall: No tenderness.   Abdominal :      General: Abdomen is flat.      Palpations: Abdomen is soft.      Tenderness: There is no abdominal tenderness. There is no right CVA tenderness, left CVA tenderness, guarding or rebound.     Musculoskeletal:          General: No deformity. Normal range of motion.      Cervical back: Normal range of motion and neck supple. No rigidity.     Lymphadenopathy:       Cervical: No cervical adenopathy.   Skin :      General: Skin is warm and dry.      Coloration: Skin is not pale.      Findings: No erythema or rash.    Neurological:       General: No focal deficit present.      Mental Status: She is alert and oriented to person, place, and time.      Cranial Nerves: No cranial nerve deficit.      Sensory: No sensory deficit.      Motor: No weakness or abnormal muscle  tone.      Coordination: Coordination normal.      Gait: Gait normal.    Psychiatric:         Mood and Affect: Mood normal.         Behavior: Behavior normal.         Judgment: Judgment normal.               Diagnostic Study Results        Labs -         Recent Results (from the past 12 hour(s))     URINALYSIS W/ REFLEX CULTURE          Collection Time: 05/24/20  5:50 PM       Specimen: Urine         Result  Value  Ref Range            Color  YELLOW/STRAW          Appearance  CLEAR  CLEAR         Specific gravity  1.029  1.003 - 1.030         pH (UA)  6.5  5.0 -  8.0         Protein  Negative  NEG mg/dL       Glucose  Negative  NEG mg/dL       Ketone  Negative  NEG mg/dL       Bilirubin  Negative  NEG         Blood  Negative  NEG         Urobilinogen  1.0  0.2 - 1.0 EU/dL       Nitrites  Negative  NEG         Leukocyte Esterase  Negative  NEG         WBC  0-4  0 - 4 /hpf       RBC  0-5  0 - 5 /hpf       Epithelial cells  FEW  FEW /lpf       Bacteria  Negative  NEG /hpf       UA:UC IF INDICATED  PENDING         Hyaline cast  0-2  0 - 5 /lpf       CBC WITH AUTOMATED DIFF          Collection Time: 05/24/20  5:50 PM          Result  Value  Ref Range            WBC  11.9 (H)  3.6 - 11.0 K/uL       RBC  4.70  3.80 - 5.20 M/uL       HGB  13.8  11.5 - 16.0 g/dL       HCT  14.741.5  82.935.0 - 47.0 %            MCV  88.3  80.0 - 99.0 FL            MCH  29.4  26.0 - 34.0 PG       MCHC  33.3  30.0 - 36.5 g/dL       RDW  56.213.2  13.011.5 - 14.5 %       PLATELET  174  150 - 400 K/uL       MPV  11.1  8.9 - 12.9 FL       NRBC  0.0  0 PER 100 WBC       ABSOLUTE NRBC  0.00  0.00 - 0.01 K/uL       NEUTROPHILS  69  32 - 75 %       LYMPHOCYTES  22  12 - 49 %       MONOCYTES  8  5 - 13 %       EOSINOPHILS  1  0 - 7 %       BASOPHILS  0  0 - 1 %       IMMATURE GRANULOCYTES  0  0.0 - 0.5 %       ABS. NEUTROPHILS  8.2 (H)  1.8 - 8.0 K/UL       ABS. LYMPHOCYTES  2.6  0.8 - 3.5 K/UL       ABS. MONOCYTES  0.9  0.0 - 1.0 K/UL       ABS. EOSINOPHILS  0.1  0.0 - 0.4 K/UL       ABS. BASOPHILS  0.0  0.0 - 0.1 K/UL       ABS. IMM. GRANS.  0.0  0.00 - 0.04 K/UL       DF  AUTOMATED  HCG URINE, QL. - POC          Collection Time: 05/24/20  6:06 PM         Result  Value  Ref Range            Pregnancy test,urine (POC)  Positive (A)  NEG             Radiologic Studies -      CT HEAD WO CONT       Final Result     Normal study                      XR CHEST PORT       Final Result     Right lower lobe pneumonia versus alveolitis                       Medical Decision Making     I am the first provider for this patient.      I reviewed the vital signs, available nursing notes, past medical history, past surgical history, family history and social history.      Vital Signs-Reviewed the patient's vital signs.   No data found.            Records Reviewed: Nursing Notes, Old Medical Records, Previous Radiology Studies and  Previous Laboratory Studies      Provider Notes (Medical Decision Making):    URI, bronchitis, PNA, RAD      ED Course:    Initial assessment performed. The patients presenting problems have been discussed, and they are in agreement with the care plan  formulated and outlined with them.  I have encouraged them to ask questions as they arise throughout their visit.      TOBACCO CESSATION COUNSELING   The patient was counseled on the dangers of tobacco use, and was advised to quit.  Reviewed  strategies to maximize success, including written materials.      DISCHARGE NOTE:   The care plan has been outline with the patient and/or family, who verbally conveyed understanding and agreement. Available results have been reviewed. Patient and/or family understand the follow up plan as outlined and discharge instructions. Should  their condition deterioration at any time after discharge the patient agrees to return, follow up sooner than outlined or seek medical assistance at the closest Emergency Room as soon as possible. Questions have been answered. Discharge instructions and  educational information regarding the patient's diagnosis as well a list of reasons why the patient would want to seek immediate medical attention, should their condition change, were reviewed directly with the patient/family              PLAN:   1.      Discharge Medication List as of 04/11/2020 12:36 PM              START taking these medications          Details        doxycycline (VIBRA-TABS) 100 mg tablet  Take 1 Tablet by mouth two (2) times a day for 10 days., Normal, Disp-20 Tablet, R-0               butalbital-acetaminophen-caff (Fioricet) 50-300-40 mg per capsule  Take 1 Capsule by mouth every four (4) hours as needed for Headache for up to 12 doses., Normal, Disp-12 Capsule, R-0               ondansetron (Zofran  ODT) 4 mg disintegrating tablet  Take 1 Tablet by mouth every eight (8) hours as needed for Nausea., Normal, Disp-10 Tablet, R-0               benzonatate (TESSALON) 200 mg capsule  Take 1 Capsule by mouth three (3) times daily as needed for Cough for up to 7 days., Normal, Disp-30 Capsule, R-0                      2.      Follow-up Information               Follow up With   Specialties  Details  Why  Contact Info              Bronwen Betters, MD  Internal Medicine      [address]   [place] [address]   [place] [address]   [phone]                 [facility] EMERGENCY DEPT  Emergency Medicine    If symptoms worsen  [address]   [place] [address]   [phone]             Return to ED if worse         Diagnosis        Clinical Impression:       1.  Acute intractable headache, unspecified headache type      2.  Flu-like symptoms      3.  Tobacco dependence         4.  Pneumonia of right lower lobe due to infectious organism

## 2020-04-11 NOTE — ED Notes (Signed)
Pt received discharge papers.  IV out and vitals stable.  Pt ambulatory to waiting room.

## 2020-04-11 NOTE — ED Notes (Signed)
Pt arrives to ED room from triage with a cc of generalized body aches and headache x 3 days.  Pt went to PCP and was tested for COVID-19 a week and a half ago.  Test was negative.  Pt is alert and oriented x 4, resting comfortably in stretcher with side rails up and call bell within reach.

## 2020-04-12 ENCOUNTER — Emergency Department: Admit: 2020-04-13 | Payer: MEDICAID

## 2020-04-12 ENCOUNTER — Inpatient Hospital Stay
Admit: 2020-04-12 | Discharge: 2020-04-13 | Disposition: A | Discharge status: Home / Self Care | Payer: MEDICAID | Attending: Emergency Medicine

## 2020-04-12 DIAGNOSIS — U071 COVID-19: Secondary | ICD-10-CM

## 2020-04-12 NOTE — Progress Notes (Signed)
RE COVID: Called patient.  Verified demographics.  Informed of positive result.  Questions answered.

## 2020-04-12 NOTE — ED Provider Notes (Signed)
ED Provider Notes by O'Bier, Leonarda Leis N, MD at 04/12/20 2050                Author: O'Bier, Adelee Hannula N, MD  Service: Emergency Medicine  Author Type: Physician       Filed: 04/13/20 1316  Date of Service: 04/12/20 2050  Status: Signed          Editor: O'Bier, Peg Fifer N, MD (Physician)               EMERGENCY DEPARTMENT HISTORY AND PHYSICAL EXAM           Date: 04/12/2020   Patient Name: Felicia Wilkins        History of Presenting Illness          Chief Complaint       Patient presents with        ?  Cough             here yesterday for cough and hard to breath told she has pneumonia; pt has productive cough. she is taking the medication as prescribed        ?  Nasal Congestion           History Provided By: Patient      HPI: Felicia Wilkins,  29 y.o. female with no significant past medical history presents to the ED with chief complaint  of fevers, cough, congestion, sneezing, chest tightness and achiness and a headache.  Symptoms started about 4 to 5 days ago.  Patient was seen in the ED yesterday and diagnosed with right-sided pneumonia.  She was started on doxycycline and Fioricet  for headache.  States her Fioricet is not working and her cough and chest discomfort has gotten worse.  Patient is partially vaccinated against Covid.  She received her first Pfizer Covid vaccine last month and is not due for her second 1 yet.  She has  not had a flu vaccine this year.  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  She was not tested for Covid or flu last night.   PCP: None        No current facility-administered medications on file prior to encounter.          Current Outpatient Medications on File Prior to Encounter          Medication  Sig  Dispense  Refill           ?  doxycycline (VIBRA-TABS) 100 mg tablet  Take 1 Tablet by mouth two (2) times a day for 10 days.  20 Tablet  0     ?  butalbital-acetaminophen-caff (Fioricet) 50-300-40 mg per capsule  Take 1 Capsule by mouth every four (4) hours as needed for Headache for up to 12  doses.  12 Capsule  0     ?  ondansetron (Zofran ODT) 4 mg disintegrating tablet  Take 1 Tablet by mouth every eight (8) hours as needed for Nausea.  10 Tablet  0           ?  benzonatate (TESSALON) 200 mg capsule  Take 1 Capsule by mouth three (3) times daily as needed for Cough for up to 7 days.  30 Capsule  0             Past History        Past Medical History:   History reviewed. No pertinent past medical history.      Past Surgical History:   History reviewed. No pertinent surgical history.  Family History:   History reviewed. No pertinent family history.      Social History:     Social History          Tobacco Use         ?  Smoking status:  Current Every Day Smoker     ?  Smokeless tobacco:  Never Used       Substance Use Topics         ?  Alcohol use:  Not Currently         ?  Drug use:  Never           Allergies:     Allergies        Allergen  Reactions         ?  Phenergan [Promethazine]  Nausea and Vomiting                Review of Systems     Review of Systems    Constitutional: Positive for chills and fever .    HENT: Positive for congestion and rhinorrhea . Negative for ear pain.     Eyes: Negative for visual disturbance.    Respiratory: Positive for cough and shortness of breath .     Cardiovascular: Positive for chest pain. Negative for palpitations.    Gastrointestinal: Negative for abdominal pain, nausea and vomiting.    Genitourinary: Negative for decreased urine volume, dysuria, flank pain and hematuria.    Musculoskeletal: Negative for back pain, myalgias and neck pain.    Skin: Negative for rash and wound.    Neurological: Positive for headaches. Negative for syncope and light-headedness.    Psychiatric/Behavioral: Negative for confusion. The patient is nervous/anxious.     All other systems reviewed and are negative.              Physical Exam      General appearance -overweight, well appearing, and in no distress   Eyes - pupils equal and reactive, extraocular eye movements intact   ENT  - mucous membranes moist, pharynx normal without lesions   Neck - supple, no significant adenopathy; non-tender to palpation   Chest -rhonchorous breath sounds bilaterally; non-tender to palpation   Heart - normal rate and regular rhythm, S1 and S2 normal, no murmurs noted   Abdomen - soft, nontender, nondistended, no masses or organomegaly   Musculoskeletal - no joint tenderness, deformity or swelling; normal ROM   Extremities - peripheral pulses normal, no pedal edema   Skin - normal coloration and turgor, no rashes   Neurological - alert, oriented x3, normal speech, no focal findings or movement disorder noted        Diagnostic Study Results        Labs -    No results found for this or any previous visit (from the past 12 hour(s)).      Radiologic Studies -      XR CHEST PORT    (Results Pending)          CT Results   (Last 48 hours)                                    04/11/20 1144    CT HEAD WO CONT  Final result            Impression:    Normal study  Narrative:    EXAM: CT HEAD WO CONT             INDICATION: intractable headache             COMPARISON: None.             CONTRAST: None.             TECHNIQUE: Unenhanced CT of the head was performed using 5 mm images. Brain and      bone windows were generated. Coronal and sagittal reformats. CT dose reduction      was achieved through use of a standardized protocol tailored for this      examination and automatic exposure control for dose modulation.               FINDINGS:      The ventricles and sulci are normal in size, shape and configuration.. There is      no significant white matter disease. There is no intracranial hemorrhage,      extra-axial collection, or mass effect. The basilar cisterns are open. No CT      evidence of acute infarct.             The bone windows demonstrate no abnormalities. The visualized portions of the      paranasal sinuses and mastoid air cells are clear.                                 CXR  Results   (Last 48 hours)                                    04/11/20 1117    XR CHEST PORT  Final result            Impression:    Right lower lobe pneumonia versus alveolitis                       Narrative:    EXAM: Chest radiograph             INDICATION: Body aches, headache, nausea             COMPARISON: None.             FINDINGS: A portable AP radiograph of the chest was obtained at 1103 hours.       There is a vague opacity in the right infrahilar lung. The cardiac and      mediastinal contours and pulmonary vascularity are normal.  The bones and soft      tissues are grossly within normal limits.                                        Medical Decision Making     I am the first provider for this patient.      I reviewed the vital signs, available nursing notes, past medical history, past surgical history, family history and social history.      Vital Signs-Reviewed the patient's vital signs.   Patient Vitals for the past 12 hrs:            Temp  Pulse  Resp  BP  SpO2  04/12/20 1826  99.1 ??F (37.3 ??C)  83  16  130/75  100 %                 Records Reviewed: Nursing Notes and Old Medical Records      Provider Notes (Medical Decision Making):    Differential diagnosis: Viral syndrome, pneumonia, Covid, flu, dehydration   We will check chest x-ray to see if there is any worsening infiltrate.  Will check flu and Covid swab.      ED Course:    Initial assessment performed. The patients presenting problems have been discussed, and they are in agreement with the care plan formulated and outlined with them.  I have encouraged them to ask questions as they arise throughout their visit.      Progress Notes:     ED Course as of 04/13/20 1315       Mon Apr 12, 2020        2229  Chest x-ray shows bilateral infiltrates.  I am suspicious for Covid.  Discussed with patient.  PCR test has been sent.  Flu test is negative.  Will  treat with Zithromax, albuterol and patient has a pulse oximeter at home to monitor  her oxygen level.  Instructed to return to ED if her pulse ox is consistently less than 90. [AO]              ED Course User Index   [AO] O'Bier, Lulu Hirschmann N, MD           Disposition:   Dc home      PLAN:   1.      Discharge Medication List as of 04/12/2020 10:29 PM              START taking these medications          Details        albuterol (PROVENTIL HFA, VENTOLIN HFA, PROAIR HFA) 90 mcg/actuation inhaler  Take 2 Puffs by inhalation every four (4) hours as needed for Wheezing., Normal, Disp-1 Each, R-0               predniSONE (STERAPRED DS) 10 mg dose pack  As directed, Normal, Disp-21 Tablet, R-0               azithromycin (Zithromax Z-Pak) 250 mg tablet  As directed, Normal, Disp-6 Tablet, R-0                     CONTINUE these medications which have NOT CHANGED          Details        doxycycline (VIBRA-TABS) 100 mg tablet  Take 1 Tablet by mouth two (2) times a day for 10 days., Normal, Disp-20 Tablet, R-0               butalbital-acetaminophen-caff (Fioricet) 50-300-40 mg per capsule  Take 1 Capsule by mouth every four (4) hours as needed for Headache for up to 12 doses., Normal, Disp-12 Capsule, R-0               ondansetron (Zofran ODT) 4 mg disintegrating tablet  Take 1 Tablet by mouth every eight (8) hours as needed for Nausea., Normal, Disp-10 Tablet, R-0               benzonatate (TESSALON) 200 mg capsule  Take 1 Capsule by mouth three (3) times daily as needed for Cough for up to 7 days., Normal, Disp-30 Capsule, R-0  2.      Follow-up Information               Follow up With  Specialties  Details  Why  Contact Info              [facility]  Emergency Medicine    If symptoms worsen  [address]   [place] [address]   [phone]             Return to ED if worse         Diagnosis        Clinical Impression:       1.  Suspected 2019 novel coronavirus infection         2.  Community acquired pneumonia, bilateral

## 2020-04-12 NOTE — ED Notes (Signed)
Discharge instructions given to patient by MD O'Bier.  Verbalized understanding of instructions.  Patient discharged without difficulty.  Patient discharged in stable condition via ambulation accompanied by self.

## 2020-04-13 LAB — INFLUENZA A+B VIRAL AGS
Flu A Antigen: NEGATIVE
Influenza A Antigen: NEGATIVE
Influenza B Antigen: NEGATIVE
Influenza B Antigen: NEGATIVE

## 2020-04-13 LAB — COVID-19: SARS-CoV-2: DETECTED — CR

## 2020-04-13 LAB — SARS-COV-2: SARS-CoV-2: DETECTED — CR

## 2020-04-13 MED ORDER — ACETAMINOPHEN 500 MG TAB
500 mg | Freq: Once | ORAL | Status: AC
Start: 2020-04-13 — End: 2020-04-12
  Administered 2020-04-13: 02:00:00 via ORAL

## 2020-04-13 MED ORDER — ALBUTEROL SULFATE HFA 90 MCG/ACTUATION AEROSOL INHALER
90 mcg/actuation | RESPIRATORY_TRACT | 0 refills | Status: AC | PRN
Start: 2020-04-13 — End: ?

## 2020-04-13 MED ORDER — IBUPROFEN 600 MG TAB
600 mg | ORAL | Status: AC
Start: 2020-04-13 — End: 2020-04-12
  Administered 2020-04-13: 02:00:00 via ORAL

## 2020-04-13 MED ORDER — AZITHROMYCIN 250 MG TAB
250 mg | ORAL_TABLET | ORAL | 0 refills | Status: AC
Start: 2020-04-13 — End: 2020-04-17

## 2020-04-13 MED ORDER — PREDNISONE 10 MG TABLETS IN A DOSE PACK
10 mg | ORAL_TABLET | ORAL | 0 refills | Status: AC
Start: 2020-04-13 — End: ?

## 2020-04-13 MED FILL — IBUPROFEN 600 MG TAB: 600 mg | ORAL | Qty: 1

## 2020-04-13 MED FILL — ACETAMINOPHEN 500 MG TAB: 500 mg | ORAL | Qty: 2

## 2020-04-13 NOTE — Progress Notes (Signed)
Patient contacted regarding COVID-19 diagnosis. Discussed COVID-19 related testing which was available at this time. Test results were positive. Patient informed of results, if available? yes.     Ambulatory Care Manager contacted the patient by telephone to perform post discharge assessment. Call within 2 business days of discharge: Yes Verified name and DOB with patient as identifiers. Provided introduction to self, and explanation of the CTN/ACM role, and reason for call due to risk factors for infection and/or exposure to COVID-19.     Symptoms reviewed with patient who verbalized the following symptoms: no new symptoms and no worsening symptoms      Due to no new or worsening symptoms encounter was not routed to provider for escalation. Discussed follow-up appointments. If no appointment was previously scheduled, appointment scheduling offered:  no.  [facility] follow up appointment(s): No future appointments.  Non-[facility] follow up appointment(s): none    Interventions to address risk factors: Education of patient/family/caregiver/guardian to support self-management-covid-19     Advance Care Planning:   Does patient have an Advance Directive: not on file.     Educated patient about risk for severe COVID-19 due to risk factors according to CDC guidelines. ACM reviewed discharge instructions, medical action plan and red flag symptoms with the patient who verbalized understanding. Discussed COVID vaccination status: yes. Education provided on COVID-19 vaccination as appropriate. Discussed exposure protocols and quarantine with CDC Guidelines. Patient was given an opportunity to verbalize any questions and concerns and agrees to contact ACM or health care provider for questions related to their healthcare.    Reviewed and educated patient on any new and changed medications related to discharge diagnosis     Was patient discharged with a pulse oximeter? no Discussed and confirmed pulse oximeter discharge instructions and  when to notify provider or seek emergency care.    ACM provided contact information. Plan for follow-up call in 5-7 days based on severity of symptoms and risk factors.

## 2020-04-20 NOTE — Progress Notes (Signed)
Patient resolved from Transition of Care episode on 04/20/20.   ACM/CTN was unsuccessful at contacting this patient today.  Patient/family was provided the following resources and education related to COVID-19 during the initial call:                         Signs, symptoms and red flags related to COVID-19            CDC exposure and quarantine guidelines            [facility] exposure contact - [phone]            Contact for their local Department of Health                 Patient has not had any additional ED or hospital visits.     No further outreach scheduled with this CTN/ACM.  Episode of Care resolved.  Patient has this CTN/ACM contact information if future needs arise.

## 2020-05-24 ENCOUNTER — Inpatient Hospital Stay
Admit: 2020-05-24 | Discharge: 2020-05-25 | Disposition: A | Discharge status: Home / Self Care | Payer: MEDICAID | Attending: Emergency Medicine

## 2020-05-24 ENCOUNTER — Emergency Department: Admit: 2020-05-25 | Payer: MEDICAID

## 2020-05-24 DIAGNOSIS — O219 Vomiting of pregnancy, unspecified: Secondary | ICD-10-CM

## 2020-05-24 LAB — CBC WITH AUTO DIFFERENTIAL
Basophils %: 0 % (ref 0–1)
Basophils Absolute: 0 10*3/uL (ref 0.0–0.1)
Eosinophils %: 1 % (ref 0–7)
Eosinophils Absolute: 0.1 10*3/uL (ref 0.0–0.4)
Granulocyte Absolute Count: 0 10*3/uL (ref 0.00–0.04)
Hematocrit: 41.5 % (ref 35.0–47.0)
Hemoglobin: 13.8 g/dL (ref 11.5–16.0)
Immature Granulocytes: 0 % (ref 0.0–0.5)
Lymphocytes %: 22 % (ref 12–49)
Lymphocytes Absolute: 2.6 10*3/uL (ref 0.8–3.5)
MCH: 29.4 PG (ref 26.0–34.0)
MCHC: 33.3 g/dL (ref 30.0–36.5)
MCV: 88.3 FL (ref 80.0–99.0)
MPV: 11.1 FL (ref 8.9–12.9)
Monocytes %: 8 % (ref 5–13)
Monocytes Absolute: 0.9 10*3/uL (ref 0.0–1.0)
NRBC Absolute: 0 10*3/uL (ref 0.00–0.01)
Neutrophils %: 69 % (ref 32–75)
Neutrophils Absolute: 8.2 10*3/uL — ABNORMAL HIGH (ref 1.8–8.0)
Nucleated RBCs: 0 PER 100 WBC
Platelets: 174 10*3/uL (ref 150–400)
RBC: 4.7 M/uL (ref 3.80–5.20)
RDW: 13.2 % (ref 11.5–14.5)
WBC: 11.9 10*3/uL — ABNORMAL HIGH (ref 3.6–11.0)

## 2020-05-24 LAB — COMPREHENSIVE METABOLIC PANEL
ALT: 19 U/L (ref 12–78)
AST: 6 U/L — ABNORMAL LOW (ref 15–37)
Albumin/Globulin Ratio: 0.8 — ABNORMAL LOW (ref 1.1–2.2)
Albumin: 3.5 g/dL (ref 3.5–5.0)
Alkaline Phosphatase: 71 U/L (ref 45–117)
Anion Gap: 3 mmol/L — ABNORMAL LOW (ref 5–15)
BUN: 12 MG/DL (ref 6–20)
Bun/Cre Ratio: 16 (ref 12–20)
CO2: 29 mmol/L (ref 21–32)
Calcium: 8.8 MG/DL (ref 8.5–10.1)
Chloride: 105 mmol/L (ref 97–108)
Creatinine: 0.74 MG/DL (ref 0.55–1.02)
EGFR IF NonAfrican American: >60 mL/min/{1.73_m2} (ref >60)
GFR African American: >60 mL/min/{1.73_m2} (ref >60)
Globulin: 4.2 g/dL — ABNORMAL HIGH (ref 2.0–4.0)
Glucose: 89 mg/dL (ref 65–100)
Potassium: 3.5 mmol/L (ref 3.5–5.1)
Sodium: 137 mmol/L (ref 136–145)
Total Bilirubin: 0.3 MG/DL (ref 0.2–1.0)
Total Protein: 7.7 g/dL (ref 6.4–8.2)

## 2020-05-24 LAB — URINALYSIS W/ REFLEX CULTURE
BACTERIA, URINE: NEGATIVE /hpf
Bacteria: NEGATIVE /hpf
Bilirubin, Urine: NEGATIVE
Bilirubin: NEGATIVE
Blood, Urine: NEGATIVE
Blood: NEGATIVE
Glucose, Ur: NEGATIVE mg/dL
Glucose: NEGATIVE mg/dL
Ketone: NEGATIVE mg/dL
Ketones, Urine: NEGATIVE mg/dL
Leukocyte Esterase, Urine: NEGATIVE
Leukocyte Esterase: NEGATIVE
Nitrite, Urine: NEGATIVE
Nitrites: NEGATIVE
Protein, UA: NEGATIVE mg/dL
Protein: NEGATIVE mg/dL
Specific Gravity, UA: 1.029 (ref 1.003–1.030)
Specific gravity: 1.029 (ref 1.003–1.030)
Urobilinogen, UA, POCT: 1 EU/dL (ref 0.2–1.0)
Urobilinogen: 1 EU/dL (ref 0.2–1.0)
pH (UA): 6.5 (ref 5.0–8.0)
pH, UA: 6.5 (ref 5.0–8.0)

## 2020-05-24 LAB — HCG URINE, QL. - POC
HCG, Pregnancy, Urine, POC: POSITIVE — AB
Pregnancy test,urine (POC): POSITIVE — AB

## 2020-05-24 LAB — CBC WITH AUTOMATED DIFF
ABS. BASOPHILS: 0 10*3/uL (ref 0.0–0.1)
ABS. EOSINOPHILS: 0.1 10*3/uL (ref 0.0–0.4)
ABS. IMM. GRANS.: 0 10*3/uL (ref 0.00–0.04)
ABS. LYMPHOCYTES: 2.6 10*3/uL (ref 0.8–3.5)
ABS. MONOCYTES: 0.9 10*3/uL (ref 0.0–1.0)
ABS. NEUTROPHILS: 8.2 10*3/uL — ABNORMAL HIGH (ref 1.8–8.0)
ABSOLUTE NRBC: 0 10*3/uL (ref 0.00–0.01)
BASOPHILS: 0 % (ref 0–1)
EOSINOPHILS: 1 % (ref 0–7)
HCT: 41.5 % (ref 35.0–47.0)
HGB: 13.8 g/dL (ref 11.5–16.0)
IMMATURE GRANULOCYTES: 0 % (ref 0.0–0.5)
LYMPHOCYTES: 22 % (ref 12–49)
MCH: 29.4 PG (ref 26.0–34.0)
MCHC: 33.3 g/dL (ref 30.0–36.5)
MCV: 88.3 FL (ref 80.0–99.0)
MONOCYTES: 8 % (ref 5–13)
MPV: 11.1 FL (ref 8.9–12.9)
NEUTROPHILS: 69 % (ref 32–75)
NRBC: 0 PER 100 WBC
PLATELET: 174 10*3/uL (ref 150–400)
RBC: 4.7 M/uL (ref 3.80–5.20)
RDW: 13.2 % (ref 11.5–14.5)
WBC: 11.9 10*3/uL — ABNORMAL HIGH (ref 3.6–11.0)

## 2020-05-24 LAB — METABOLIC PANEL, COMPREHENSIVE
A-G Ratio: 0.8 — ABNORMAL LOW (ref 1.1–2.2)
ALT (SGPT): 19 U/L (ref 12–78)
AST (SGOT): 6 U/L — ABNORMAL LOW (ref 15–37)
Albumin: 3.5 g/dL (ref 3.5–5.0)
Alk. phosphatase: 71 U/L (ref 45–117)
Anion gap: 3 mmol/L — ABNORMAL LOW (ref 5–15)
BUN/Creatinine ratio: 16 (ref 12–20)
BUN: 12 MG/DL (ref 6–20)
Bilirubin, total: 0.3 MG/DL (ref 0.2–1.0)
CO2: 29 mmol/L (ref 21–32)
Calcium: 8.8 MG/DL (ref 8.5–10.1)
Chloride: 105 mmol/L (ref 97–108)
Creatinine: 0.74 MG/DL (ref 0.55–1.02)
GFR est AA: >60 mL/min/{1.73_m2} (ref >60)
GFR est non-AA: >60 mL/min/{1.73_m2} (ref >60)
Globulin: 4.2 g/dL — ABNORMAL HIGH (ref 2.0–4.0)
Glucose: 89 mg/dL (ref 65–100)
Potassium: 3.5 mmol/L (ref 3.5–5.1)
Protein, total: 7.7 g/dL (ref 6.4–8.2)
Sodium: 137 mmol/L (ref 136–145)

## 2020-05-24 NOTE — ED Provider Notes (Signed)
ED Provider Notes by O'Bier, Bronwyn Belasco N, MD at 05/24/20 2341                Author: O'Bier, Arlyne Brandes N, MD  Service: Emergency Medicine  Author Type: Physician       Filed: 05/25/20 1648  Date of Service: 05/24/20 2341  Status: Signed          Editor: O'Bier, Denae Zulueta N, MD (Physician)               EMERGENCY DEPARTMENT HISTORY AND PHYSICAL EXAM           Date: 05/24/2020   Patient Name: Felicia Wilkins        History of Presenting Illness          Chief Complaint       Patient presents with        ?  Dizziness             LMP 02/05/2020 G3, P2, A0.  pt is pregnant.  2-3 days ago she developed headaches, dizzy spells with n/v.  pt is unable to tolerate any po intake.   pt has had >10  episodes of vomiting today        ?  Vomiting        ?  Headache           History Provided By: Patient      HPI: Felicia Wilkins,  30 y.o. female with PMHx significant for asthma who is G4, P2 A1 (last menstrual cycle was  02/10/2020 but may have spotted in November) presents to the ED with nausea, vomiting, and cramping lower abdominal pain along with dizzy spells.  Patient reports that her symptoms started about 2 to 3 days ago.  She states she is not able to keep any  fluids down including water today.  Denies any dysuria, hematuria, urinary frequency.  States she is had 12-13 episodes of nonbloody emesis today.  Denies any diarrhea.  She does report feeling lightheaded and having some chills and headache today.  States  she had COVID last month.  [facility]  Does not have an OB/GYN appointment set up yet.  Denies any vaginal bleeding or discharge.. States that her last pregnancy 2 years ago ended in miscarriage.   This was a twin gestation.      PCP: None        No current facility-administered medications on file prior to encounter.          Current Outpatient Medications on File Prior to Encounter          Medication  Sig  Dispense  Refill           ?  albuterol  (PROVENTIL HFA, VENTOLIN HFA, PROAIR HFA) 90 mcg/actuation inhaler  Take 2 Puffs by inhalation every four (4) hours as needed for Wheezing.  1 Each  0     ?  predniSONE (STERAPRED DS) 10 mg dose pack  As directed  21 Tablet  0     ?  butalbital-acetaminophen-caff (Fioricet) 50-300-40 mg per capsule  Take 1 Capsule by mouth every four (4) hours as needed for Headache for up to 12 doses.  12 Capsule  0           ?  ondansetron (Zofran ODT) 4 mg disintegrating tablet  Take 1 Tablet by mouth every eight (8) hours as needed for Nausea.  10 Tablet  0  Past History        Past Medical History:     Past Medical History:        Diagnosis  Date         ?  Asthma             Past Surgical History:   History reviewed. No pertinent surgical history.      Family History:   History reviewed. No pertinent family history.      Social History:     Social History          Tobacco Use         ?  Smoking status:  Former Smoker     ?  Smokeless tobacco:  Never Used       Substance Use Topics         ?  Alcohol use:  Not Currently         ?  Drug use:  Never           Allergies:     Allergies        Allergen  Reactions         ?  Phenergan [Promethazine]  Nausea and Vomiting                Review of Systems     Review of Systems    Constitutional: Positive for appetite change and chills . Negative for fever.    HENT: Negative.     Respiratory: Negative for cough, chest tightness, shortness of breath and wheezing.     Cardiovascular: Negative for chest pain, palpitations and leg swelling.    Gastrointestinal: Positive for nausea and vomiting . Negative for blood in stool and diarrhea.    Genitourinary: Positive for pelvic pain. Negative for dysuria, flank pain, hematuria, vaginal bleeding and vaginal discharge.    Musculoskeletal: Negative for back pain, myalgias and neck pain.    Skin: Negative for rash and wound.    Neurological: Positive for light-headedness. Negative for syncope.    Psychiatric/Behavioral: Negative for  confusion. The patient is nervous/anxious.     All other systems reviewed and are negative.              Physical Exam      General appearance - well nourished, well appearing, and in no distress, actively vomiting at the time of my interview   Eyes - pupils equal and reactive, extraocular eye movements intact   ENT - mucous membranes moist, pharynx normal without lesions   Neck - supple, no significant adenopathy; non-tender to palpation   Chest - clear to auscultation, no wheezes, rales or rhonchi; non-tender to palpation   Heart - normal rate and regular rhythm, S1 and S2 normal, no murmurs noted   Abdomen - soft, tender to palpation suprapubic area, no rebound or guarding nondistended, no masses or organomegaly   Musculoskeletal - no joint tenderness, deformity or swelling; normal ROM   Extremities - peripheral pulses normal, no pedal edema   Skin - normal coloration and turgor, no rashes   Neurological - alert, oriented x3, normal speech, no focal findings or movement disorder noted        Diagnostic Study Results        Labs -         Recent Results (from the past 12 hour(s))     URINALYSIS W/ REFLEX CULTURE          Collection Time: 05/24/20  5:50 PM  Specimen: Urine         Result  Value  Ref Range            Color  YELLOW/STRAW          Appearance  CLEAR  CLEAR         Specific gravity  1.029  1.003 - 1.030         pH (UA)  6.5  5.0 - 8.0         Protein  Negative  NEG mg/dL       Glucose  Negative  NEG mg/dL       Ketone  Negative  NEG mg/dL       Bilirubin  Negative  NEG         Blood  Negative  NEG         Urobilinogen  1.0  0.2 - 1.0 EU/dL       Nitrites  Negative  NEG         Leukocyte Esterase  Negative  NEG         WBC  0-4  0 - 4 /hpf       RBC  0-5  0 - 5 /hpf       Epithelial cells  FEW  FEW /lpf       Bacteria  Negative  NEG /hpf       UA:UC IF INDICATED  CULTURE NOT INDICATED BY UA RESULT  CNI         Hyaline cast  0-2  0 - 5 /lpf       CBC WITH AUTOMATED DIFF          Collection Time:  05/24/20  5:50 PM         Result  Value  Ref Range            WBC  11.9 (H)  3.6 - 11.0 K/uL       RBC  4.70  3.80 - 5.20 M/uL            HGB  13.8  11.5 - 16.0 g/dL            HCT  41.5  35.0 - 47.0 %       MCV  88.3  80.0 - 99.0 FL       MCH  29.4  26.0 - 34.0 PG       MCHC  33.3  30.0 - 36.5 g/dL       RDW  13.2  11.5 - 14.5 %       PLATELET  174  150 - 400 K/uL       MPV  11.1  8.9 - 12.9 FL       NRBC  0.0  0 PER 100 WBC       ABSOLUTE NRBC  0.00  0.00 - 0.01 K/uL       NEUTROPHILS  69  32 - 75 %       LYMPHOCYTES  22  12 - 49 %       MONOCYTES  8  5 - 13 %       EOSINOPHILS  1  0 - 7 %       BASOPHILS  0  0 - 1 %       IMMATURE GRANULOCYTES  0  0.0 - 0.5 %       ABS. NEUTROPHILS  8.2 (H)  1.8 - 8.0 K/UL  ABS. LYMPHOCYTES  2.6  0.8 - 3.5 K/UL       ABS. MONOCYTES  0.9  0.0 - 1.0 K/UL       ABS. EOSINOPHILS  0.1  0.0 - 0.4 K/UL       ABS. BASOPHILS  0.0  0.0 - 0.1 K/UL       ABS. IMM. GRANS.  0.0  0.00 - 0.04 K/UL       DF  AUTOMATED          METABOLIC PANEL, COMPREHENSIVE          Collection Time: 05/24/20  5:50 PM         Result  Value  Ref Range            Sodium  137  136 - 145 mmol/L       Potassium  3.5  3.5 - 5.1 mmol/L       Chloride  105  97 - 108 mmol/L       CO2  29  21 - 32 mmol/L       Anion gap  3 (L)  5 - 15 mmol/L       Glucose  89  65 - 100 mg/dL       BUN  12  6 - 20 MG/DL            Creatinine  0.74  0.55 - 1.02 MG/DL            BUN/Creatinine ratio  16  12 - 20         GFR est AA  >60  >60 ml/min/1.79m       GFR est non-AA  >60  >60 ml/min/1.784m      Calcium  8.8  8.5 - 10.1 MG/DL       Bilirubin, total  0.3  0.2 - 1.0 MG/DL       ALT (SGPT)  19  12 - 78 U/L       AST (SGOT)  6 (L)  15 - 37 U/L       Alk. phosphatase  71  45 - 117 U/L       Protein, total  7.7  6.4 - 8.2 g/dL       Albumin  3.5  3.5 - 5.0 g/dL       Globulin  4.2 (H)  2.0 - 4.0 g/dL       A-G Ratio  0.8 (L)  1.1 - 2.2         HCG URINE, QL. - POC          Collection Time: 05/24/20  6:06 PM         Result  Value  Ref  Range            Pregnancy test,urine (POC)  Positive (A)  NEG             Radiologic Studies -      [place] UTS < 14 WKS SNGL       Final Result     Single viable intrauterine pregnancy with estimated gestational age     of 6 1 weeks day. Normal ovaries.                 [place] TRANSVAGINAL OB       Final Result     Single viable intrauterine pregnancy with estimated gestational age     of 6 12 weeks day. Normal ovaries.  CT Results   (Last 48 hours)          None                 CXR Results   (Last 48 hours)          None                       Medical Decision Making     I am the first provider for this patient.      I reviewed the vital signs, available nursing notes, past medical history, past surgical history, family history and social history.      Vital Signs-Reviewed the patient's vital signs.   Patient Vitals for the past 12 hrs:            Temp  Pulse  Resp  BP  SpO2            05/24/20 2018  --  75  --  113/72  100 %            05/24/20 1743  98.4 ??F (36.9 ??C)  76  16  126/79  100 %                 Records Reviewed: Nursing Notes and Old Medical Records      Provider Notes (Medical Decision Making):    Differential diagnosis: Gastritis, pancreatitis, dehydration, electrolyte abnormality, hyperemesis   We'll check CBC, CMP, lipase, UA.  Given that patient has had no prenatal care and dates are unknown, will obtain pelvic ultrasound      ED Course:    Initial assessment performed. The patients presenting problems have been discussed, and they are in agreement with the care plan formulated and outlined with them.  I have encouraged them to ask questions as they arise throughout their visit.      Progress Notes:    Ultrasound shows a viable IUP with EGA of [redacted] weeks and 5 days.  Patient is feeling better and able to tolerate p.o. in the ED.   Will discharge with instructions to follow-up as soon as possible with OB/GYN and return to ED for worsening symptoms.      Disposition:   Discharge  home      PLAN:   1.      Discharge Medication List as of 05/25/2020  1:01 AM              START taking these medications          Details        ondansetron hcl (Zofran) 4 mg tablet  Take 1 Tablet by mouth every eight (8) hours as needed for Nausea., Normal, Disp-20 Tablet, R-0                     CONTINUE these medications which have NOT CHANGED          Details        butalbital-acetaminophen-caff (Fioricet) 50-300-40 mg per capsule  Take 1 Capsule by mouth every four (4) hours as needed for Headache for up to 12 doses., Normal, Disp-12 Capsule, R-0               ondansetron (Zofran ODT) 4 mg disintegrating tablet  Take 1 Tablet by mouth every eight (8) hours as needed for Nausea., Normal, Disp-10 Tablet, R-0               albuterol (PROVENTIL HFA, VENTOLIN HFA,  PROAIR HFA) 90 mcg/actuation inhaler  Take 2 Puffs by inhalation every four (4) hours as needed for Wheezing., Normal, Disp-1 Each, R-0               predniSONE (STERAPRED DS) 10 mg dose pack  As directed, Normal, Disp-21 Tablet, R-0                      2.      Follow-up Information               Follow up With  Specialties  Details  Why  Contact Info              [facility] EM[facility]  Emergency Medicine  Go to   If symptoms worsen  [address]   [place]      [phone]  [place] [address]             Return to ED if worse         Diagnosis        Clinical Impression:       1.  Nausea and vomiting, intractability of vomiting not specified, unspecified vomiting type         2.  Less than [redacted] weeks gestation of pregnancy

## 2020-05-24 NOTE — ED Notes (Signed)
Pt was very upset about wait times. She wanted to speak to Charge Nurse about waiting 5 hours. Charge Nurse, Alycia Rossetti, notified.

## 2020-05-24 NOTE — ED Notes (Signed)
Pt presents to the ED for vomiting and dizziness x 3 days. Pt states she has not been able to keep anything down and keeps vomiting up yellow emesis. Pt states she is pregnant and is not sure how far along she is. LMP in October.

## 2020-05-25 MED ORDER — ONDANSETRON (PF) 4 MG/2 ML INJECTION
4 mg/2 mL | INTRAMUSCULAR | Status: AC
Start: 2020-05-25 — End: 2020-05-24
  Administered 2020-05-25: 04:00:00 via INTRAVENOUS

## 2020-05-25 MED ORDER — ONDANSETRON HCL 4 MG TAB
4 mg | ORAL_TABLET | Freq: Three times a day (TID) | ORAL | 0 refills | Status: AC | PRN
Start: 2020-05-25 — End: ?

## 2020-05-25 MED ORDER — SODIUM CHLORIDE 0.9% BOLUS IV
0.9 % | INTRAVENOUS | Status: AC
Start: 2020-05-25 — End: 2020-05-25
  Administered 2020-05-25: 04:00:00 via INTRAVENOUS

## 2020-05-25 MED FILL — SODIUM CHLORIDE 0.9 % IV: INTRAVENOUS | Qty: 1000

## 2020-05-25 MED FILL — ONDANSETRON (PF) 4 MG/2 ML INJECTION: 4 mg/2 mL | INTRAMUSCULAR | Qty: 2

## 2020-05-25 NOTE — ED Notes (Signed)
I have reviewed discharge instructions with the patient.  The patient verbalized understanding.

## 2020-05-28 ENCOUNTER — Inpatient Hospital Stay
Admit: 2020-05-28 | Discharge: 2020-05-28 | Disposition: A | Discharge status: Home / Self Care | Payer: MEDICAID | Attending: Emergency Medical Services

## 2020-05-28 DIAGNOSIS — O2311 Infections of bladder in pregnancy, first trimester: Secondary | ICD-10-CM

## 2020-05-28 LAB — COMPREHENSIVE METABOLIC PANEL
ALT: 23 U/L (ref 12–78)
AST: 11 U/L — ABNORMAL LOW (ref 15–37)
Albumin/Globulin Ratio: 0.8 — ABNORMAL LOW (ref 1.1–2.2)
Albumin: 3.4 g/dL — ABNORMAL LOW (ref 3.5–5.0)
Alkaline Phosphatase: 62 U/L (ref 45–117)
Anion Gap: 6 mmol/L (ref 5–15)
BUN: 7 MG/DL (ref 6–20)
Bun/Cre Ratio: 10 — ABNORMAL LOW (ref 12–20)
CO2: 26 mmol/L (ref 21–32)
Calcium: 8.7 MG/DL (ref 8.5–10.1)
Chloride: 105 mmol/L (ref 97–108)
Creatinine: 0.69 MG/DL (ref 0.55–1.02)
EGFR IF NonAfrican American: >60 mL/min/{1.73_m2} (ref >60)
GFR African American: >60 mL/min/{1.73_m2} (ref >60)
Globulin: 4.1 g/dL — ABNORMAL HIGH (ref 2.0–4.0)
Glucose: 94 mg/dL (ref 65–100)
Potassium: 3.7 mmol/L (ref 3.5–5.1)
Sodium: 137 mmol/L (ref 136–145)
Total Bilirubin: 0.6 MG/DL (ref 0.2–1.0)
Total Protein: 7.5 g/dL (ref 6.4–8.2)

## 2020-05-28 LAB — URINALYSIS W/ REFLEX CULTURE
Bilirubin, Urine: NEGATIVE
Bilirubin: NEGATIVE
Glucose, Ur: NEGATIVE mg/dL
Glucose: NEGATIVE mg/dL
Nitrite, Urine: NEGATIVE
Nitrites: NEGATIVE
Specific Gravity, UA: 1.028 (ref 1.003–1.030)
Specific gravity: 1.028 (ref 1.003–1.030)
Urobilinogen, UA, POCT: 1 EU/dL (ref 0.2–1.0)
Urobilinogen: 1 EU/dL (ref 0.2–1.0)
pH (UA): 6.5 (ref 5.0–8.0)
pH, UA: 6.5 (ref 5.0–8.0)

## 2020-05-28 LAB — CBC WITH AUTO DIFFERENTIAL
Basophils %: 0 % (ref 0–1)
Basophils Absolute: 0 10*3/uL (ref 0.0–0.1)
Eosinophils %: 0 % (ref 0–7)
Eosinophils Absolute: 0 10*3/uL (ref 0.0–0.4)
Granulocyte Absolute Count: 0 10*3/uL (ref 0.00–0.04)
Hematocrit: 42.2 % (ref 35.0–47.0)
Hemoglobin: 13.6 g/dL (ref 11.5–16.0)
Immature Granulocytes: 0 % (ref 0.0–0.5)
Lymphocytes %: 13 % (ref 12–49)
Lymphocytes Absolute: 1.3 10*3/uL (ref 0.8–3.5)
MCH: 29.2 PG (ref 26.0–34.0)
MCHC: 32.2 g/dL (ref 30.0–36.5)
MCV: 90.6 FL (ref 80.0–99.0)
MPV: 11.5 FL (ref 8.9–12.9)
Monocytes %: 6 % (ref 5–13)
Monocytes Absolute: 0.6 10*3/uL (ref 0.0–1.0)
NRBC Absolute: 0 10*3/uL (ref 0.00–0.01)
Neutrophils %: 81 % — ABNORMAL HIGH (ref 32–75)
Neutrophils Absolute: 7.8 10*3/uL (ref 1.8–8.0)
Nucleated RBCs: 0 PER 100 WBC
Platelets: 182 10*3/uL (ref 150–400)
RBC: 4.66 M/uL (ref 3.80–5.20)
RDW: 13.3 % (ref 11.5–14.5)
WBC: 9.8 10*3/uL (ref 3.6–11.0)

## 2020-05-28 LAB — LIPASE
Lipase: 34 U/L — ABNORMAL LOW (ref 73–393)
Lipase: 34 U/L — ABNORMAL LOW (ref 73–393)

## 2020-05-28 LAB — HCG URINE, QL. - POC
HCG, Pregnancy, Urine, POC: POSITIVE — AB
Pregnancy test,urine (POC): POSITIVE — AB

## 2020-05-28 LAB — BETA HCG, QT
Beta HCG, QT: 62794 m[IU]/mL — ABNORMAL HIGH (ref 0–6)
hCG Quant: 62794 m[IU]/mL — ABNORMAL HIGH (ref 0–6)

## 2020-05-28 LAB — METABOLIC PANEL, COMPREHENSIVE
A-G Ratio: 0.8 — ABNORMAL LOW (ref 1.1–2.2)
ALT (SGPT): 23 U/L (ref 12–78)
AST (SGOT): 11 U/L — ABNORMAL LOW (ref 15–37)
Albumin: 3.4 g/dL — ABNORMAL LOW (ref 3.5–5.0)
Alk. phosphatase: 62 U/L (ref 45–117)
Anion gap: 6 mmol/L (ref 5–15)
BUN/Creatinine ratio: 10 — ABNORMAL LOW (ref 12–20)
BUN: 7 MG/DL (ref 6–20)
Bilirubin, total: 0.6 MG/DL (ref 0.2–1.0)
CO2: 26 mmol/L (ref 21–32)
Calcium: 8.7 MG/DL (ref 8.5–10.1)
Chloride: 105 mmol/L (ref 97–108)
Creatinine: 0.69 MG/DL (ref 0.55–1.02)
GFR est AA: >60 mL/min/{1.73_m2} (ref >60)
GFR est non-AA: >60 mL/min/{1.73_m2} (ref >60)
Globulin: 4.1 g/dL — ABNORMAL HIGH (ref 2.0–4.0)
Glucose: 94 mg/dL (ref 65–100)
Potassium: 3.7 mmol/L (ref 3.5–5.1)
Protein, total: 7.5 g/dL (ref 6.4–8.2)
Sodium: 137 mmol/L (ref 136–145)

## 2020-05-28 LAB — CBC WITH AUTOMATED DIFF
ABS. BASOPHILS: 0 10*3/uL (ref 0.0–0.1)
ABS. EOSINOPHILS: 0 10*3/uL (ref 0.0–0.4)
ABS. IMM. GRANS.: 0 10*3/uL (ref 0.00–0.04)
ABS. LYMPHOCYTES: 1.3 10*3/uL (ref 0.8–3.5)
ABS. MONOCYTES: 0.6 10*3/uL (ref 0.0–1.0)
ABS. NEUTROPHILS: 7.8 10*3/uL (ref 1.8–8.0)
ABSOLUTE NRBC: 0 10*3/uL (ref 0.00–0.01)
BASOPHILS: 0 % (ref 0–1)
EOSINOPHILS: 0 % (ref 0–7)
HCT: 42.2 % (ref 35.0–47.0)
HGB: 13.6 g/dL (ref 11.5–16.0)
IMMATURE GRANULOCYTES: 0 % (ref 0.0–0.5)
LYMPHOCYTES: 13 % (ref 12–49)
MCH: 29.2 PG (ref 26.0–34.0)
MCHC: 32.2 g/dL (ref 30.0–36.5)
MCV: 90.6 FL (ref 80.0–99.0)
MONOCYTES: 6 % (ref 5–13)
MPV: 11.5 FL (ref 8.9–12.9)
NEUTROPHILS: 81 % — ABNORMAL HIGH (ref 32–75)
NRBC: 0 PER 100 WBC
PLATELET: 182 10*3/uL (ref 150–400)
RBC: 4.66 M/uL (ref 3.80–5.20)
RDW: 13.3 % (ref 11.5–14.5)
WBC: 9.8 10*3/uL (ref 3.6–11.0)

## 2020-05-28 MED ORDER — METOCLOPRAMIDE 10 MG TAB
10 mg | ORAL_TABLET | Freq: Four times a day (QID) | ORAL | 0 refills | Status: AC | PRN
Start: 2020-05-28 — End: 2020-06-07

## 2020-05-28 MED ORDER — CLOTRIMAZOLE 1 % VAGINAL CREAM
1 % | Freq: Every evening | VAGINAL | 0 refills | Status: AC
Start: 2020-05-28 — End: ?

## 2020-05-28 MED ORDER — METOCLOPRAMIDE 5 MG/ML IJ SOLN
5 mg/mL | INTRAMUSCULAR | Status: AC
Start: 2020-05-28 — End: 2020-05-28
  Administered 2020-05-28: 17:00:00 via INTRAVENOUS

## 2020-05-28 MED ORDER — FAMOTIDINE (PF) 20 MG/2 ML IV
20 mg/2 mL | INTRAVENOUS | Status: AC
Start: 2020-05-28 — End: 2020-05-28
  Administered 2020-05-28: 15:00:00 via INTRAVENOUS

## 2020-05-28 MED ORDER — ONDANSETRON (PF) 4 MG/2 ML INJECTION
4 mg/2 mL | INTRAMUSCULAR | Status: AC
Start: 2020-05-28 — End: 2020-05-28
  Administered 2020-05-28: 14:00:00 via INTRAVENOUS

## 2020-05-28 MED ORDER — PROMETHAZINE 25 MG RECTAL SUPPOSITORY
25 mg | Freq: Four times a day (QID) | RECTAL | 0 refills | Status: AC | PRN
Start: 2020-05-28 — End: 2020-06-04

## 2020-05-28 MED ORDER — SODIUM CHLORIDE 0.9% BOLUS IV
0.9 % | Freq: Once | INTRAVENOUS | Status: AC
Start: 2020-05-28 — End: 2020-05-28
  Administered 2020-05-28: 14:00:00 via INTRAVENOUS

## 2020-05-28 MED ORDER — ACETAMINOPHEN 500 MG TAB
500 mg | Freq: Once | ORAL | Status: AC
Start: 2020-05-28 — End: 2020-05-28
  Administered 2020-05-28: 17:00:00 via ORAL

## 2020-05-28 MED ORDER — SODIUM CHLORIDE 0.9 % IV PIGGY BACK
1 gram | INTRAVENOUS | Status: AC
Start: 2020-05-28 — End: 2020-05-28
  Administered 2020-05-28: 17:00:00 via INTRAVENOUS

## 2020-05-28 MED ORDER — CEPHALEXIN 500 MG CAP
500 mg | ORAL_CAPSULE | Freq: Three times a day (TID) | ORAL | 0 refills | Status: AC
Start: 2020-05-28 — End: ?

## 2020-05-28 MED FILL — METOCLOPRAMIDE 5 MG/ML IJ SOLN: 5 mg/mL | INTRAMUSCULAR | Qty: 2

## 2020-05-28 MED FILL — ONDANSETRON (PF) 4 MG/2 ML INJECTION: 4 mg/2 mL | INTRAMUSCULAR | Qty: 2

## 2020-05-28 MED FILL — CEFTRIAXONE 1 GRAM SOLUTION FOR INJECTION: 1 gram | INTRAMUSCULAR | Qty: 1

## 2020-05-28 MED FILL — FAMOTIDINE (PF) 20 MG/2 ML IV: 20 mg/2 mL | INTRAVENOUS | Qty: 2

## 2020-05-28 MED FILL — SODIUM CHLORIDE 0.9 % IV: INTRAVENOUS | Qty: 1000

## 2020-05-28 MED FILL — ACETAMINOPHEN 500 MG TAB: 500 mg | ORAL | Qty: 2

## 2020-05-28 NOTE — ED Provider Notes (Signed)
ED Provider Notes by Gearldine Bienenstock, DO at 05/28/20 6063                Author: Gearldine Bienenstock, DO  Service: Emergency Medicine  Author Type: Physician       Filed: 05/28/20 2146  Date of Service: 05/28/20 0925  Status: Signed          Editor: Gearldine Bienenstock, DO (Physician)               EMERGENCY DEPARTMENT HISTORY AND PHYSICAL EXAM           Date: 05/28/2020   Patient Name: Felicia Wilkins        History of Presenting Illness          Chief Complaint       Patient presents with        ?  Vomiting             [redacted] weeks pregnant. Here about a week ago and given fluids. Started vomiting almost as soon as she left and continued to vomit the entire time.           History Provided By: Patient      HPI: Felicia Wilkins,  30 y.o. female presents to the ED with cc of G3P1A1 ~[redacted] weeks pregnant with n/v.  Patient  states that she had been seen here earlier this week for similar complaint.  She states that she was discharged home with Zofran and despite Zofran she is continued to vomit.  She states that she is not been able to really keep anything down over the  course of the week.  She states that she does have some mild abdominal discomfort in the upper quadrants without any alleviating or exacerbating factors.  She denies any recent diarrhea.  She denies any fever chills.  She denies any cough or cold symptoms.   She denies any vaginal bleeding or discharge.  She denies any urinary issues such as frequency, urgency or pain with urination.  There have been no rashes.  She denies any other sick contacts.      There are no other complaints, changes, or physical findings at this time.      PCP: None           Past History        Past Medical History:   Asthma       Past Surgical History:   None      Family History:   Non-contributory      Social History:     Social History          Tobacco Use         ?  Smoking status:  No     ?  Smokeless tobacco:  No       Substance Use Topics         ?  Alcohol use:  No         ?   Drug use:  No           Allergies:     Allergies        Allergen  Reactions         ?  Flagyl [Metronidazole]  Itching                Review of Systems     Review of Systems    Constitutional: Negative.  Negative for appetite change, chills, fatigue and fever.    HENT: Negative.  Negative  for congestion, rhinorrhea, sinus pressure and sore throat.     Eyes: Negative.     Respiratory: Negative.  Negative for cough, choking, chest tightness, shortness of breath and wheezing.     Cardiovascular: Negative.  Negative for chest pain, palpitations and leg swelling.    Gastrointestinal: Positive for abdominal pain (epigastric abd pain ) , nausea and vomiting.  Negative for constipation and diarrhea.    Endocrine: Negative.     Genitourinary: Negative.  Negative for difficulty urinating, dysuria, flank pain, urgency, vaginal bleeding and vaginal discharge.    Musculoskeletal: Negative.     Skin: Negative.     Neurological: Negative.  Negative for dizziness, speech difficulty, weakness, light-headedness, numbness and headaches.    Psychiatric/Behavioral: Negative.     All other systems reviewed and are negative.           Physical Exam     Physical Exam   Vitals and nursing note reviewed.   Constitutional:        General: She is not in acute distress.     Appearance: She is well-developed. She is obese. She is not diaphoretic.   HENT:       Head: Normocephalic and atraumatic.      Mouth/Throat:      Mouth: Mucous membranes are dry.      Pharynx: No oropharyngeal  exudate.   Eyes :       Extraocular Movements: Extraocular movements intact.      Conjunctiva/sclera: Conjunctivae normal.      Pupils: Pupils are equal, round, and reactive to light.   Neck:       Vascular: No JVD.      Trachea: No tracheal deviation.    Cardiovascular:       Rate and Rhythm: Normal rate and regular rhythm.      Heart sounds: Normal heart sounds. No murmur heard.        Pulmonary:       Effort: Pulmonary effort is normal. No respiratory distress.       Breath sounds: Normal breath sounds. No stridor. No wheezing or  rales.    Abdominal:      General: There is no distension.      Palpations: Abdomen is soft.      Tenderness: There is abdominal tenderness  (mild ttp bil UQ). There is no guarding or rebound.     Musculoskeletal:          General: No tenderness. Normal range of motion.      Cervical back: Normal range of motion and neck supple.      Right lower leg: No edema.      Left lower leg: No edema.    Skin:      General: Skin is warm and dry.      Capillary Refill: Capillary refill takes less than 2 seconds.    Neurological:       Mental Status: She is alert and oriented to person, place, and time.      Cranial Nerves: No cranial nerve deficit.      Comments: No gross motor or sensory deficits     Psychiatric :         Mood and Affect: Mood normal.         Behavior: Behavior normal.               Diagnostic Study Results        Labs -  Recent Results (from the past 24 hour(s))     URINALYSIS W/ REFLEX CULTURE          Collection Time: 05/28/20  9:01 AM       Specimen: Urine         Result  Value  Ref Range            Color  DARK YELLOW          Appearance  CLOUDY (A)  CLEAR         Specific gravity  1.028  1.003 - 1.030         pH (UA)  6.5  5.0 - 8.0         Protein  TRACE (A)  NEG mg/dL       Glucose  Negative  NEG mg/dL       Ketone  TRACE (A)  NEG mg/dL       Bilirubin  Negative  NEG         Blood  TRACE (A)  NEG         Urobilinogen  1.0  0.2 - 1.0 EU/dL       Nitrites  Negative  NEG         Leukocyte Esterase  SMALL (A)  NEG         WBC  20-50  0 - 4 /hpf       RBC  5-10  0 - 5 /hpf       Epithelial cells  MODERATE (A)  FEW /lpf       Bacteria  2+ (A)  NEG /hpf       UA:UC IF INDICATED  URINE CULTURE ORDERED (A)  CNI              Yeast  PRESENT (A)  NEG         CBC WITH AUTOMATED DIFF          Collection Time: 05/28/20  9:04 AM         Result  Value  Ref Range            WBC  9.8  3.6 - 11.0 K/uL       RBC  4.66  3.80 - 5.20 M/uL       HGB  13.6   11.5 - 16.0 g/dL       HCT  42.2  35.0 - 47.0 %       MCV  90.6  80.0 - 99.0 FL       MCH  29.2  26.0 - 34.0 PG       MCHC  32.2  30.0 - 36.5 g/dL       RDW  13.3  11.5 - 14.5 %       PLATELET  182  150 - 400 K/uL       MPV  11.5  8.9 - 12.9 FL       NRBC  0.0  0 PER 100 WBC       ABSOLUTE NRBC  0.00  0.00 - 0.01 K/uL       NEUTROPHILS  81 (H)  32 - 75 %       LYMPHOCYTES  13  12 - 49 %       MONOCYTES  6  5 - 13 %       EOSINOPHILS  0  0 - 7 %       BASOPHILS  0  0 - 1 %  IMMATURE GRANULOCYTES  0  0.0 - 0.5 %       ABS. NEUTROPHILS  7.8  1.8 - 8.0 K/UL       ABS. LYMPHOCYTES  1.3  0.8 - 3.5 K/UL       ABS. MONOCYTES  0.6  0.0 - 1.0 K/UL       ABS. EOSINOPHILS  0.0  0.0 - 0.4 K/UL       ABS. BASOPHILS  0.0  0.0 - 0.1 K/UL       ABS. IMM. GRANS.  0.0  0.00 - 0.04 K/UL       DF  AUTOMATED          METABOLIC PANEL, COMPREHENSIVE          Collection Time: 05/28/20  9:04 AM         Result  Value  Ref Range            Sodium  137  136 - 145 mmol/L       Potassium  3.7  3.5 - 5.1 mmol/L       Chloride  105  97 - 108 mmol/L            CO2  26  21 - 32 mmol/L            Anion gap  6  5 - 15 mmol/L       Glucose  94  65 - 100 mg/dL       BUN  7  6 - 20 MG/DL       Creatinine  0.69  0.55 - 1.02 MG/DL       BUN/Creatinine ratio  10 (L)  12 - 20         GFR est AA  >60  >60 ml/min/1.1m       GFR est non-AA  >60  >60 ml/min/1.765m      Calcium  8.7  8.5 - 10.1 MG/DL       Bilirubin, total  0.6  0.2 - 1.0 MG/DL       ALT (SGPT)  23  12 - 78 U/L       AST (SGOT)  11 (L)  15 - 37 U/L       Alk. phosphatase  62  45 - 117 U/L       Protein, total  7.5  6.4 - 8.2 g/dL       Albumin  3.4 (L)  3.5 - 5.0 g/dL       Globulin  4.1 (H)  2.0 - 4.0 g/dL       A-G Ratio  0.8 (L)  1.1 - 2.2         HCG URINE, QL. - POC          Collection Time: 05/28/20  9:04 AM         Result  Value  Ref Range            Pregnancy test,urine (POC)  Positive (A)  NEG         LIPASE          Collection Time: 05/28/20  9:04 AM         Result  Value  Ref  Range            Lipase  34 (L)  73 - 393 U/L       BETA HCG, QT          Collection Time: 05/28/20  9:04 AM         Result  Value  Ref Range            Beta HCG, QT  62,794 (H)  0 - 6 MIU/ML           Radiologic Studies -      No orders to display          CT Results   (Last 48 hours)          None                 CXR Results   (Last 48 hours)          None                    Medical Decision Making     I am the first provider for this patient.      I reviewed the vital signs, available nursing notes, past medical history, past surgical history, family history and social history.      Vital Signs-Reviewed the patient's vital signs.   Patient Vitals for the past 12 hrs:            Temp  Pulse  Resp  BP  SpO2            05/28/20 0759  98.5 ??F (36.9 ??C)  95  22  127/79  100 %           Records Reviewed: Nursing Notes, Old Medical Records, Previous Radiology Studies and  Previous Laboratory Studies      Provider Notes (Medical Decision Making):    DDx- Dehydration, electrolyte abnormality, UTI           ED Course:    Initial assessment performed. The patients presenting problems have been discussed, and they are in agreement with the care plan formulated and outlined with them.  I have encouraged them to ask questions as they arise throughout their visit.        ED Course as of 05/28/20 2140       Fri May 28, 2020        1141  Pt states nausea better would like something to drink. Pt does have a UTI will dose with Rocephin here in the ED. Pt states she is still having a headache,  will dose with Reglan IV and Tylenol. Plan for discharge.     [JH]              ED Course User Index   [JH] Gearldine Bienenstock, DO           Pt feeling better, headache resolved, pt tolerating PO. Will dc home, Keflex, discussed Mycelex cream for yeast infection. Discussed prenatal Vit, Vit B6 may help also with vomiting episodes. Discussed importance of knowing her Beta HCG from today as  it help to know if there are any issues that develop in  near future such as bleeding or pain and cramping.          Disposition:   DC home       DISCHARGE PLAN:   1.      Discharge Medication List as of 05/28/2020 12:37 PM              START taking these medications          Details        cephALEXin (Keflex) 500 mg capsule  Take 1 Capsule by mouth three (3) times daily.,  Normal, Disp-15 Capsule, R-0               clotrimazole (MYCELEX) 1 % vaginal cream  Insert 1 Applicator into vagina nightly., Normal, Disp-20 g, R-0               metoclopramide HCl (Reglan) 10 mg tablet  Take 1 Tablet by mouth every six (6) hours as needed for Nausea for up to 10 days., Normal, Disp-12 Tablet, R-0               promethazine (PHENERGAN) 25 mg suppository  Insert 1 Suppository into rectum every six (6) hours as needed for Nausea for up to 7 days., Normal, Disp-6 Suppository, R-0                      2.      Follow-up Information      None             3.  Return to ED if worse         Diagnosis        Clinical Impression:       1.  Non-intractable vomiting with nausea, unspecified vomiting type      2.  Acute cystitis without hematuria      3.  Vaginal yeast infection         4.  Less than [redacted] weeks gestation of pregnancy            Attestations:      Gearldine Bienenstock, DO      Please note that this dictation was completed with Dragon, the computer voice recognition software.  Quite often unanticipated grammatical, syntax, homophones, and other interpretive errors are  inadvertently transcribed by the computer software.  Please disregard these errors.  Please excuse any errors that have escaped final proofreading.  Thank you.

## 2020-05-29 LAB — CULTURE, URINE
Colonies Counted: 80000
Colony Count: 80000

## 2021-07-14 ENCOUNTER — Encounter: Attending: Obstetrics & Gynecology

## 2021-07-14 ENCOUNTER — Ambulatory Visit: Payer: MEDICAID | Attending: Obstetrics & Gynecology

## 2021-07-29 ENCOUNTER — Ambulatory Visit: Admit: 2021-07-29 | Payer: MEDICAID | Attending: Obstetrics & Gynecology

## 2021-07-29 DIAGNOSIS — E282 Polycystic ovarian syndrome: Secondary | ICD-10-CM

## 2021-07-29 NOTE — Progress Notes (Signed)
Problem Visit  Chief Complaint   Patient presents with    Gyn Exam    New Patient       Felicia Wilkins is a 31 y.o.  presenting for problem visit. Her main concern today is a cyst on her left butt cheek. She notes that it has been present for the past 9 months (she first noticed it midway through her pregnancy). She notes that she initially saw a [facility] who recommended she see her PCP. She saw her PCP but they stated they did not deal with this, they recommended antibiotic treatment. She states the area waxes and wanes in size. She has never noticed it drain. She notes that is very painful if she is sitting for prolonged periods. She notes that she will occasionally have her leg go numb from the pressure in this area.     She also notes a history of PCOS with irregular periods. 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 She had a vaginal delivery in September 2022 and breastfed for 4 months. She has not had a period since delivery.      Ob/Gyn Hx:  G4 P3  - 3 vaginal deliveries. 1 SAB  LMP- PCOS  Menses- PCOS  Contraception- not currently sexually active  SA- not currently.      Past Medical History:   Diagnosis Date    Asthma     PCOS (polycystic ovarian syndrome)        No past surgical history on file.    No family history on file.    Social History     Socioeconomic History    Marital status: SINGLE     Spouse name: Not on file    Number of children: Not on file    Years of education: Not on file    Highest education level: Not on file   Occupational History    Not on file   Tobacco Use    Smoking status: Former    Smokeless tobacco: Never   Substance and Sexual Activity    Alcohol use: Not Currently    Drug use: Never    Sexual activity: Not Currently   Other Topics Concern    Not on file   Social History Narrative    Not on file     Social Determinants of Health     Financial Resource Strain: Not on file   Food Insecurity: Not on file   Transportation Needs: Not on file    Physical Activity: Not on file   Stress: Not on file   Social Connections: Not on file   Intimate Partner Violence: Not on file   Housing Stability: Not on file       Current Outpatient Medications   Medication Sig Dispense Refill    albuterol (PROVENTIL HFA, VENTOLIN HFA, PROAIR HFA) 90 mcg/actuation inhaler Take 2 Puffs by inhalation every four (4) hours as needed for Wheezing. 1 Each 0    cephALEXin (Keflex) 500 mg capsule Take 1 Capsule by mouth three (3) times daily. (Patient not taking: Reported on 07/29/2021) 15 Capsule 0    clotrimazole (MYCELEX) 1 % vaginal cream Insert 1 Applicator into vagina nightly. (Patient not taking: Reported on 07/29/2021) 20 g 0    ondansetron hcl (Zofran) 4 mg tablet Take 1 Tablet by mouth every eight (8) hours as needed for Nausea. (Patient not taking: Reported on 07/29/2021) 20 Tablet 0    predniSONE (STERAPRED DS) 10 mg dose pack  As directed (Patient not taking: Reported on 07/29/2021) 21 Tablet 0    butalbital-acetaminophen-caff (Fioricet) 50-300-40 mg per capsule Take 1 Capsule by mouth every four (4) hours as needed for Headache for up to 12 doses. (Patient not taking: Reported on 07/29/2021) 12 Capsule 0    ondansetron (Zofran ODT) 4 mg disintegrating tablet Take 1 Tablet by mouth every eight (8) hours as needed for Nausea. (Patient not taking: Reported on 07/29/2021) 10 Tablet 0       Allergies   Allergen Reactions    Flagyl [Metronidazole] Itching    Phenergan [Promethazine] Nausea and Vomiting       Review of Systems - History obtained from the patient  Constitutional: negative for weight loss, fever, night sweats  HEENT: negative for hearing loss, earache, congestion, snoring, sorethroat  CV: negative for chest pain, palpitations, edema  Resp: negative for cough, shortness of breath, wheezing  GI: negative for change in bowel habits, abdominal pain, black or bloody stools  GU: negative for frequency, dysuria, hematuria, vaginal discharge  MSK: negative for back pain, joint  pain, muscle pain  Breast: negative for breast lumps, nipple discharge, galactorrhea  Skin :negative for itching, rash, hives  Neuro: negative for dizziness, headache, confusion, weakness  Psych: negative for anxiety, depression, change in mood  Heme/lymph: negative for bleeding, bruising, pallor    Physical Exam    Visit Vitals  Ht 5\' 4"  (1.626 m)   Wt 256 lb (116.1 kg)   LMP  (LMP Unknown)   BMI 43.94 kg/m         OBGyn Exam      Constitutional  Appearance: well-nourished, well developed, alert, in no acute distress    HENT  Head and Face: appears normal    Neck  Inspection/Palpation: normal appearance, no masses or tenderness  Thyroid: gland size normal, nontender    Chest  Respiratory Effort: non-labored breathing    Cardiovascular  Extremities: no peripheral edema    Gastrointestinal  Abdominal Examination: abdomen non-distended, non-tender to palpation, no masses present  Liver and spleen: no hepatomegaly present, spleen not palpable  Hernias: no hernias identified    Genitourinary deferred    Small nodule, less than pea sized palpable in patient's gluteal cleft on left side (although had to be guided by patient in order to determine as unable to see without assistance)    Skin  General Inspection: no rash, no lesions identified    Neurologic/Psychiatric  Mental Status:  Orientation: grossly oriented to person, place and time  Mood and Affect: mood normal, affect appropriate      Assessment/Plan:    Gluteal cyst  - Difficult to palpate on exam today. No obvious abscess, tender to touch per patient report  - Referral to colorectal surgery for management    2. PCOS (polycystic ovarian syndrome)  - Recommend RV with ultrasound for PCOS  - Briefly reviewed options for management of irregular periods, including use of OCPs. She does not have any contraindications to estrogen.       , MD

## 2021-09-13 ENCOUNTER — Encounter

## 2021-09-13 ENCOUNTER — Encounter: Attending: Obstetrics & Gynecology

## 2021-09-13 ENCOUNTER — Ambulatory Visit

## 2021-09-13 ENCOUNTER — Ambulatory Visit: Attending: Obstetrics & Gynecology

## 2021-09-13 NOTE — Progress Notes (Unsigned)
Problem Visit    Felicia Wilkins is a 31 y.o.  presenting for problem visit. Her main concern today is       G4P3-  LMP:  Menses:  B/C:      [place] today:      Per Nursing Note:  ***  OB History   Gravida Para Term Preterm AB Living   4   3 0 1 3   SAB IAB Ectopic Molar Multiple Live Births                     Past Medical History:   Diagnosis Date    Asthma     PCOS (polycystic ovarian syndrome)        No past surgical history on file.    No family history on file.    Social History     Socioeconomic History    Marital status: Single     Spouse name: Not on file    Number of children: Not on file    Years of education: Not on file    Highest education level: Not on file   Occupational History    Not on file   Tobacco Use    Smoking status: Former    Smokeless tobacco: Never   Substance and Sexual Activity    Alcohol use: Not Currently    Drug use: Never    Sexual activity: Not on file   Other Topics Concern    Not on file   Social History Narrative    Not on file     Social Determinants of Health     Financial Resource Strain: Not on file   Food Insecurity: Not on file   Transportation Needs: Not on file   Physical Activity: Not on file   Stress: Not on file   Social Connections: Not on file   Intimate Partner Violence: Not on file   Housing Stability: Not on file       Current Outpatient Medications   Medication Sig Dispense Refill    albuterol sulfate HFA (PROVENTIL;VENTOLIN;PROAIR) 108 (90 Base) MCG/ACT inhaler Inhale 2 puffs into the lungs every 4 hours as needed      butalbital-APAP-caffeine 50-300-40 MG CAPS per capsule Take 1 capsule by mouth every 4 hours as needed Max Daily Amount: 6 capsules      cephALEXin (KEFLEX) 500 MG capsule Take 1 capsule by mouth 3 times daily      clotrimazole (LOTRIMIN) 1 % vaginal cream Place 1 applicator vaginally      ondansetron (ZOFRAN-ODT) 4 MG disintegrating tablet Take 1 tablet by mouth every 8 hours as needed      ondansetron (ZOFRAN) 4 MG tablet Take 1 tablet by mouth  every 8 hours as needed      predniSONE 10 MG (21) TBPK As directed       No current facility-administered medications for this visit.       Allergies   Allergen Reactions    Metronidazole Itching    Promethazine Nausea And Vomiting       Review of Systems - History obtained from the patient  Constitutional: negative for weight loss, fever, night sweats  HEENT: negative for hearing loss, earache, congestion, snoring, sorethroat  CV: negative for chest pain, palpitations, edema  Resp: negative for cough, shortness of breath, wheezing  GI: negative for change in bowel habits, abdominal pain, black or bloody stools  GU: negative for frequency, dysuria, hematuria, vaginal discharge  MSK: negative for  back pain, joint pain, muscle pain  Breast: negative for breast lumps, nipple discharge, galactorrhea  Skin :negative for itching, rash, hives  Neuro: negative for dizziness, headache, confusion, weakness  Psych: negative for anxiety, depression, change in mood  Heme/lymph: negative for bleeding, bruising, pallor    Physical Exam    There were no vitals taken for this visit.      OBGyn Exam      Constitutional  Appearance: well-nourished, well developed, alert, in no acute distress    HENT  Head and Face: appears normal    Neck  Inspection/Palpation: normal appearance, no masses or tenderness  Thyroid: gland size normal, nontender    Chest  Respiratory Effort: non-labored breathing    Cardiovascular  Extremities: no peripheral edema    Gastrointestinal  Abdominal Examination: abdomen non-distended, non-tender to palpation, no masses present  Liver and spleen: no hepatomegaly present, spleen not palpable  Hernias: no hernias identified    Genitourinary  External Genitalia: normal appearance for age, no discharge present, no tenderness present, no inflammatory lesions present, no masses present, no atrophy present  Vagina: normal vaginal vault without central or paravaginal defects, no discharge present, no inflammatory  lesions present, no masses present  Bladder: non-tender to palpation  Urethra: appears normal  Cervix: normal   Uterus: normal size, shape and consistency  Adnexa: no adnexal tenderness present, no adnexal masses present  Perineum: perineum within normal limits, no evidence of trauma, no rashes or skin lesions present    Skin  General Inspection: no rash, no lesions identified    Neurologic/Psychiatric  Mental Status:  Orientation: grossly oriented to person, place and time  Mood and Affect: mood normal, affect appropriate      Assessment/Plan:      Landry Mellow, MA

## 2022-03-01 ENCOUNTER — Ambulatory Visit: Payer: MEDICAID | Attending: Obstetrics & Gynecology
